# Patient Record
Sex: Male | Born: 1965 | Race: White | Hispanic: No | Marital: Married | Smoking: Never smoker
Health system: Southern US, Community
[De-identification: ages and names within clinical notes are randomized; demographics above are authoritative.]

## PROBLEM LIST (undated history)

## (undated) DIAGNOSIS — M6530 Trigger finger, unspecified finger: Secondary | ICD-10-CM

## (undated) DIAGNOSIS — I10 Essential (primary) hypertension: Secondary | ICD-10-CM

## (undated) DIAGNOSIS — M1990 Unspecified osteoarthritis, unspecified site: Secondary | ICD-10-CM

## (undated) DIAGNOSIS — E785 Hyperlipidemia, unspecified: Secondary | ICD-10-CM

## (undated) DIAGNOSIS — M109 Gout, unspecified: Secondary | ICD-10-CM

## (undated) DIAGNOSIS — F419 Anxiety disorder, unspecified: Secondary | ICD-10-CM

## (undated) DIAGNOSIS — F329 Major depressive disorder, single episode, unspecified: Secondary | ICD-10-CM

## (undated) DIAGNOSIS — F32A Depression, unspecified: Secondary | ICD-10-CM

## (undated) HISTORY — PX: HERNIA REPAIR: SHX51

---
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
## 2008-06-22 HISTORY — PX: UMBILICAL HERNIA REPAIR: SHX196

## 2009-10-06 IMAGING — CR DG ORBITS FOR FOREIGN BODY
2 series · 2 of 2 positions shown · non-contrast
Comparison: None

CLINICAL DATA: Pre MRI orbits.

ORBITS FOR FOREIGN BODY - 2 VIEW

[w waters (1 of 2)]
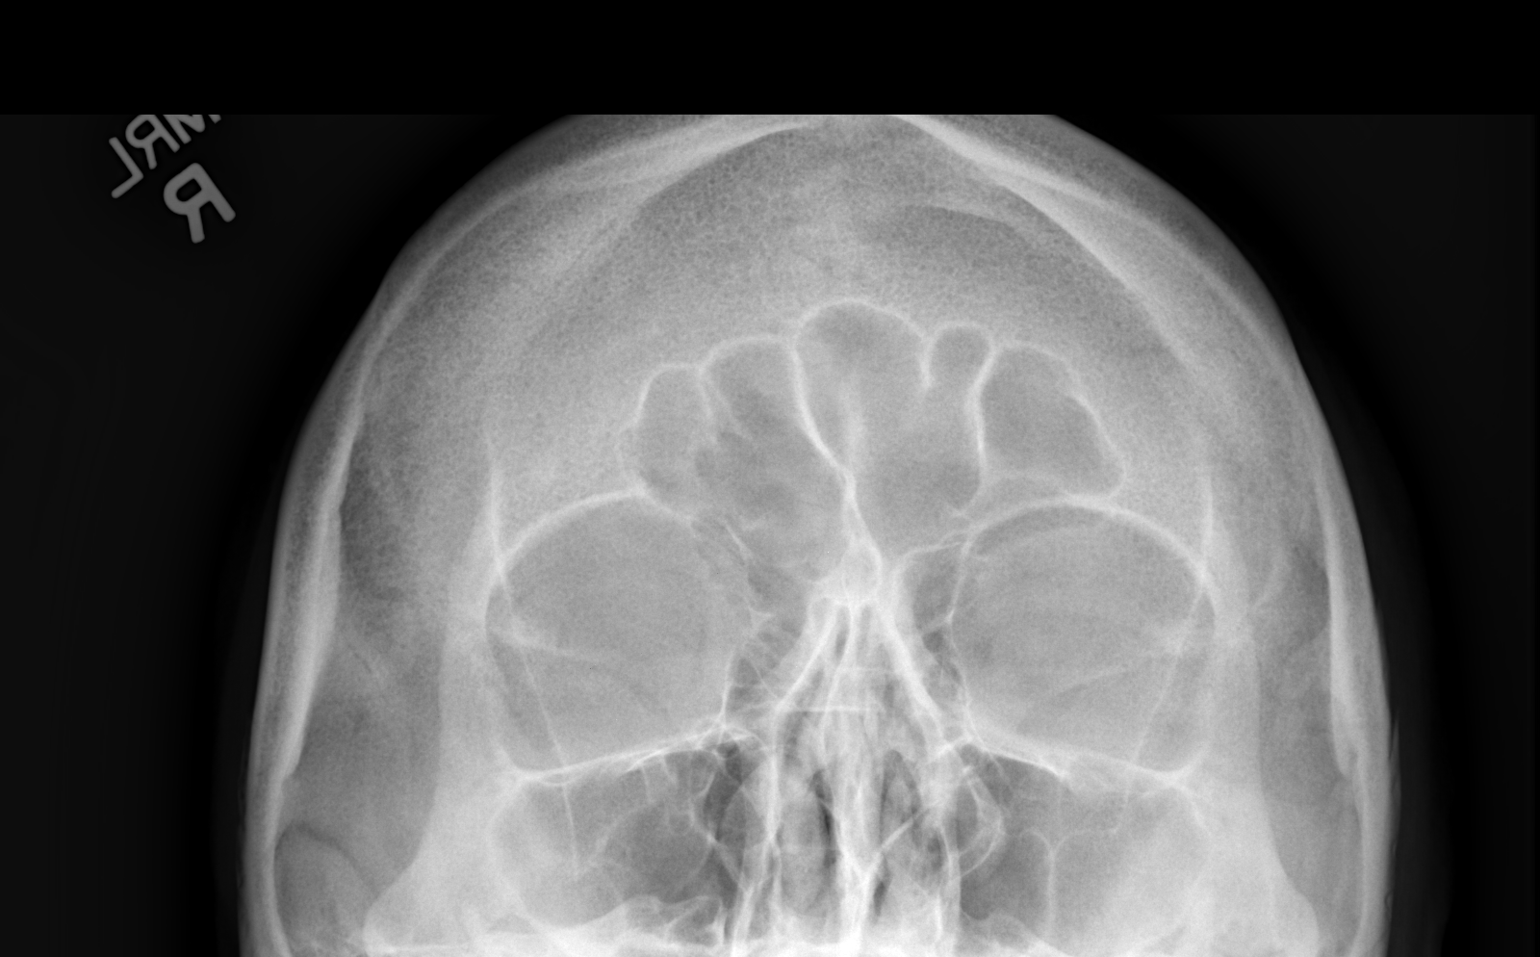

[w waters (2 of 2)]
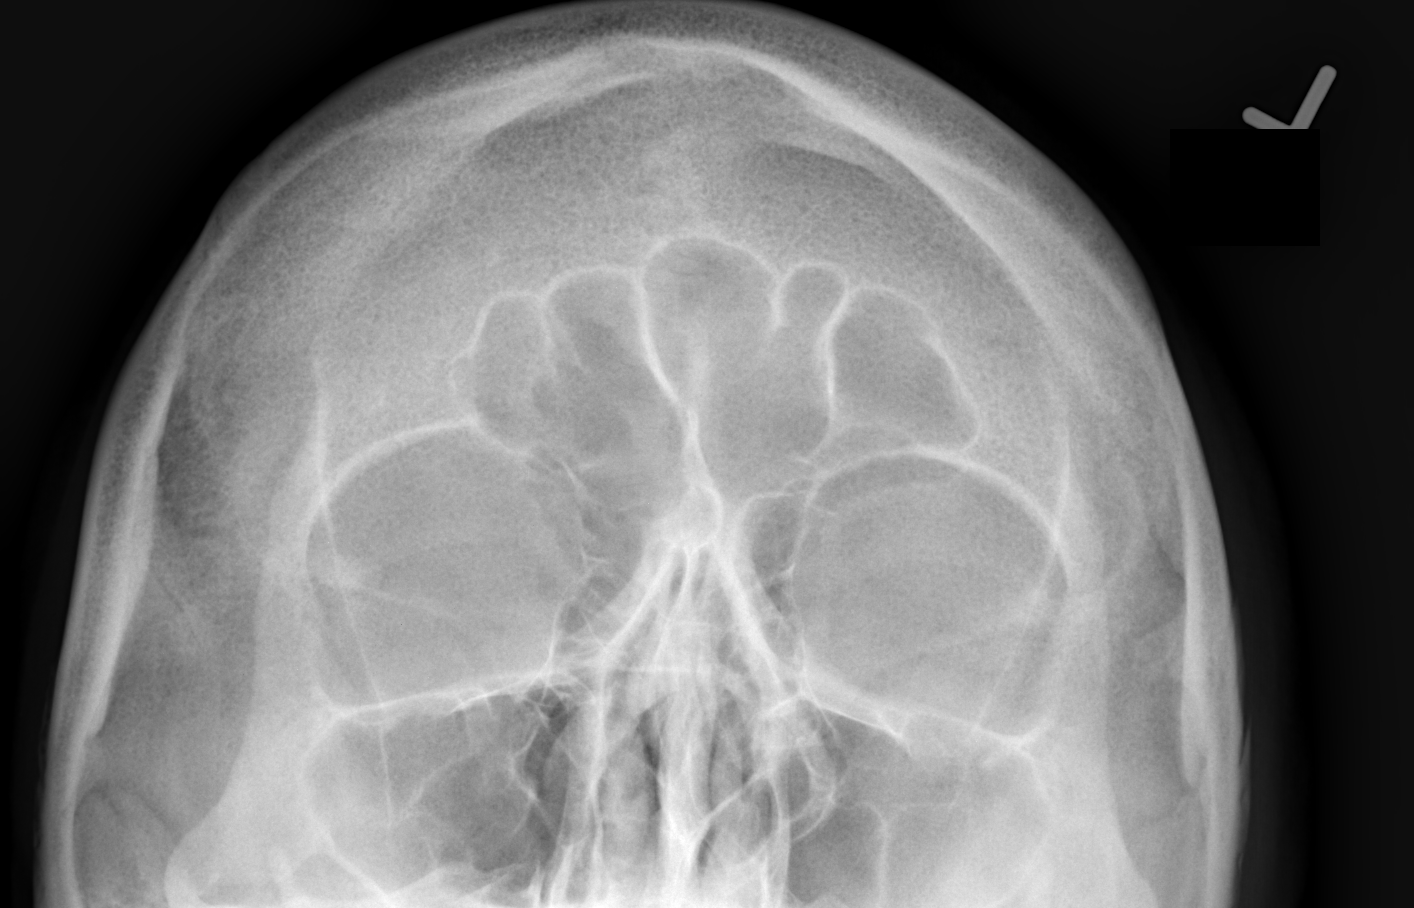

[2 of 2 positions shown; findings below may reference images not displayed]

FINDINGS: No metallic foreign bodies are seen.  The visualized
paranasal sinuses are clear.
IMPRESSION: Negative orbits for metallic foreign body.

## 2010-06-22 HISTORY — PX: SHOULDER ARTHROSCOPY WITH ROTATOR CUFF REPAIR: SHX5685

## 2010-06-26 ENCOUNTER — Ambulatory Visit (HOSPITAL_COMMUNITY)
Admission: RE | Admit: 2010-06-26 | Discharge: 2010-06-26 | Payer: Self-pay | Source: Home / Self Care | Attending: Unknown Physician Specialty | Admitting: Unknown Physician Specialty

## 2013-11-17 ENCOUNTER — Other Ambulatory Visit: Payer: Self-pay | Admitting: Orthopedic Surgery

## 2013-12-07 ENCOUNTER — Encounter (HOSPITAL_BASED_OUTPATIENT_CLINIC_OR_DEPARTMENT_OTHER): Payer: Self-pay | Admitting: *Deleted

## 2013-12-07 NOTE — Progress Notes (Signed)
Will call for recent labs has never had ekg-will need one if anesth wants

## 2013-12-07 NOTE — Progress Notes (Signed)
12/07/13 1308  OBSTRUCTIVE SLEEP APNEA  Have you ever been diagnosed with sleep apnea through a sleep study? No  Do you snore loudly (loud enough to be heard through closed doors)?  0  Do you often feel tired, fatigued, or sleepy during the daytime? 0  Has anyone observed you stop breathing during your sleep? 0  Do you have, or are you being treated for high blood pressure? 1  BMI more than 35 kg/m2? 0  Age over 49 years old? 1  Neck circumference greater than 40 cm/16 inches? 1  Gender: 1  Obstructive Sleep Apnea Score 4  Score 4 or greater  Results sent to PCP

## 2013-12-11 ENCOUNTER — Ambulatory Visit (HOSPITAL_BASED_OUTPATIENT_CLINIC_OR_DEPARTMENT_OTHER): Payer: 59 | Admitting: Anesthesiology

## 2013-12-11 ENCOUNTER — Encounter (HOSPITAL_BASED_OUTPATIENT_CLINIC_OR_DEPARTMENT_OTHER): Payer: 59 | Admitting: Anesthesiology

## 2013-12-11 ENCOUNTER — Ambulatory Visit (HOSPITAL_BASED_OUTPATIENT_CLINIC_OR_DEPARTMENT_OTHER)
Admission: RE | Admit: 2013-12-11 | Discharge: 2013-12-11 | Disposition: A | Discharge status: Home / Self Care | Payer: 59 | Source: Ambulatory Visit | Attending: Orthopedic Surgery | Admitting: Orthopedic Surgery

## 2013-12-11 ENCOUNTER — Encounter (HOSPITAL_BASED_OUTPATIENT_CLINIC_OR_DEPARTMENT_OTHER)
Admission: RE | Disposition: A | Discharge status: Home / Self Care | Payer: Self-pay | Source: Ambulatory Visit | Attending: Orthopedic Surgery

## 2013-12-11 ENCOUNTER — Encounter (HOSPITAL_BASED_OUTPATIENT_CLINIC_OR_DEPARTMENT_OTHER): Payer: Self-pay | Admitting: *Deleted

## 2013-12-11 DIAGNOSIS — M795 Residual foreign body in soft tissue: Secondary | ICD-10-CM | POA: Insufficient documentation

## 2013-12-11 DIAGNOSIS — Z189 Retained foreign body fragments, unspecified material: Secondary | ICD-10-CM | POA: Insufficient documentation

## 2013-12-11 DIAGNOSIS — I10 Essential (primary) hypertension: Secondary | ICD-10-CM | POA: Insufficient documentation

## 2013-12-11 DIAGNOSIS — E785 Hyperlipidemia, unspecified: Secondary | ICD-10-CM | POA: Insufficient documentation

## 2013-12-11 DIAGNOSIS — M109 Gout, unspecified: Secondary | ICD-10-CM | POA: Insufficient documentation

## 2013-12-11 HISTORY — DX: Hyperlipidemia, unspecified: E78.5

## 2013-12-11 HISTORY — DX: Gout, unspecified: M10.9

## 2013-12-11 HISTORY — PX: MASS EXCISION: SHX2000

## 2013-12-11 HISTORY — DX: Essential (primary) hypertension: I10

## 2013-12-11 LAB — POCT HEMOGLOBIN-HEMACUE: Hemoglobin: 15.9 g/dL (ref 13.0–17.0)

## 2013-12-11 SURGERY — EXCISION MASS
Anesthesia: Monitor Anesthesia Care | Site: Finger | Laterality: Left

## 2013-12-11 MED ORDER — LIDOCAINE HCL (PF) 1 % IJ SOLN
INTRAMUSCULAR | Status: AC
  Filled 2013-12-11: qty 5

## 2013-12-11 MED ORDER — FENTANYL CITRATE 0.05 MG/ML IJ SOLN
INTRAMUSCULAR | Status: DC | PRN
  Administered 2013-12-11: 100 ug via INTRAVENOUS

## 2013-12-11 MED ORDER — MIDAZOLAM HCL 2 MG/2ML IJ SOLN
INTRAMUSCULAR | Status: AC
  Filled 2013-12-11: qty 2

## 2013-12-11 MED ORDER — LIDOCAINE HCL (PF) 1 % IJ SOLN
INTRAMUSCULAR | Status: DC | PRN
  Administered 2013-12-11: 1.5 mL

## 2013-12-11 MED ORDER — BUPIVACAINE HCL (PF) 0.25 % IJ SOLN
INTRAMUSCULAR | Status: AC
  Filled 2013-12-11: qty 30

## 2013-12-11 MED ORDER — CEFAZOLIN SODIUM-DEXTROSE 2-3 GM-% IV SOLR
INTRAVENOUS | Status: AC
  Filled 2013-12-11: qty 50

## 2013-12-11 MED ORDER — MIDAZOLAM HCL 2 MG/2ML IJ SOLN: 1.0000 mg | INTRAMUSCULAR | Status: DC | PRN

## 2013-12-11 MED ORDER — HYDROCODONE-ACETAMINOPHEN 5-325 MG PO TABS: 1.0000 | ORAL_TABLET | Freq: Four times a day (QID) | ORAL | Status: DC | PRN

## 2013-12-11 MED ORDER — CHLORHEXIDINE GLUCONATE 4 % EX LIQD: 60.0000 mL | Freq: Once | CUTANEOUS | Status: DC

## 2013-12-11 MED ORDER — LIDOCAINE HCL (CARDIAC) 20 MG/ML IV SOLN
INTRAVENOUS | Status: DC | PRN
  Administered 2013-12-11: 5 mg via INTRAVENOUS

## 2013-12-11 MED ORDER — BUPIVACAINE HCL (PF) 0.25 % IJ SOLN
INTRAMUSCULAR | Status: DC | PRN
  Administered 2013-12-11: 1.5 mL

## 2013-12-11 MED ORDER — FENTANYL CITRATE 0.05 MG/ML IJ SOLN: 50.0000 ug | INTRAMUSCULAR | Status: DC | PRN

## 2013-12-11 MED ORDER — CEFAZOLIN SODIUM-DEXTROSE 2-3 GM-% IV SOLR: 2.0000 g | INTRAVENOUS | Status: DC

## 2013-12-11 MED ORDER — FENTANYL CITRATE 0.05 MG/ML IJ SOLN
INTRAMUSCULAR | Status: AC
  Filled 2013-12-11: qty 4

## 2013-12-11 MED ORDER — MIDAZOLAM HCL 5 MG/5ML IJ SOLN
INTRAMUSCULAR | Status: DC | PRN
  Administered 2013-12-11: 2 mg via INTRAVENOUS

## 2013-12-11 MED ORDER — CEFAZOLIN SODIUM-DEXTROSE 2-3 GM-% IV SOLR
2.0000 g | INTRAVENOUS | Status: AC
Start: 2013-12-11 — End: 2013-12-11
  Administered 2013-12-11: 2 g via INTRAVENOUS

## 2013-12-11 MED ORDER — PROPOFOL 10 MG/ML IV BOLUS
INTRAVENOUS | Status: DC | PRN
  Administered 2013-12-11 (×4): 20 mg via INTRAVENOUS

## 2013-12-11 MED ORDER — LACTATED RINGERS IV SOLN
INTRAVENOUS | Status: DC
  Administered 2013-12-11: 12:00:00 via INTRAVENOUS

## 2013-12-11 SURGICAL SUPPLY — 50 items
BANDAGE COBAN STERILE 2 (GAUZE/BANDAGES/DRESSINGS) IMPLANT
BLADE MINI RND TIP GREEN BEAV (BLADE) IMPLANT
BLADE SURG 15 STRL LF DISP TIS (BLADE) ×1 IMPLANT
BLADE SURG 15 STRL SS (BLADE) ×1
BNDG COHESIVE 1X5 TAN STRL LF (GAUZE/BANDAGES/DRESSINGS) ×2 IMPLANT
BNDG COHESIVE 3X5 TAN STRL LF (GAUZE/BANDAGES/DRESSINGS) IMPLANT
BNDG ESMARK 4X9 LF (GAUZE/BANDAGES/DRESSINGS) ×2 IMPLANT
BNDG GAUZE ELAST 4 BULKY (GAUZE/BANDAGES/DRESSINGS) IMPLANT
CHLORAPREP W/TINT 26ML (MISCELLANEOUS) ×2 IMPLANT
CORDS BIPOLAR (ELECTRODE) ×2 IMPLANT
COVER MAYO STAND STRL (DRAPES) ×2 IMPLANT
COVER TABLE BACK 60X90 (DRAPES) ×2 IMPLANT
CUFF TOURNIQUET SINGLE 18IN (TOURNIQUET CUFF) ×2 IMPLANT
DECANTER SPIKE VIAL GLASS SM (MISCELLANEOUS) IMPLANT
DRAIN PENROSE 1/2X12 LTX STRL (WOUND CARE) IMPLANT
DRAPE EXTREMITY T 121X128X90 (DRAPE) ×2 IMPLANT
DRAPE SURG 17X23 STRL (DRAPES) ×2 IMPLANT
GAUZE SPONGE 4X4 12PLY STRL (GAUZE/BANDAGES/DRESSINGS) ×2 IMPLANT
GAUZE XEROFORM 1X8 LF (GAUZE/BANDAGES/DRESSINGS) ×2 IMPLANT
GLOVE BIO SURGEON STRL SZ 6.5 (GLOVE) ×2 IMPLANT
GLOVE BIO SURGEON STRL SZ7.5 (GLOVE) ×2 IMPLANT
GLOVE BIOGEL PI IND STRL 8 (GLOVE) ×1 IMPLANT
GLOVE BIOGEL PI IND STRL 8.5 (GLOVE) ×1 IMPLANT
GLOVE BIOGEL PI INDICATOR 8 (GLOVE) ×1
GLOVE BIOGEL PI INDICATOR 8.5 (GLOVE) ×1
GLOVE SURG ORTHO 8.0 STRL STRW (GLOVE) ×2 IMPLANT
GOWN STRL REUS W/ TWL LRG LVL3 (GOWN DISPOSABLE) IMPLANT
GOWN STRL REUS W/TWL LRG LVL3 (GOWN DISPOSABLE)
GOWN STRL REUS W/TWL XL LVL3 (GOWN DISPOSABLE) ×2 IMPLANT
NDL SAFETY ECLIPSE 18X1.5 (NEEDLE) IMPLANT
NEEDLE 27GAX1X1/2 (NEEDLE) ×2 IMPLANT
NEEDLE HYPO 18GX1.5 SHARP (NEEDLE)
NS IRRIG 1000ML POUR BTL (IV SOLUTION) ×2 IMPLANT
PACK BASIN DAY SURGERY FS (CUSTOM PROCEDURE TRAY) ×2 IMPLANT
PAD CAST 3X4 CTTN HI CHSV (CAST SUPPLIES) IMPLANT
PADDING CAST ABS 3INX4YD NS (CAST SUPPLIES)
PADDING CAST ABS 4INX4YD NS (CAST SUPPLIES) ×1
PADDING CAST ABS COTTON 3X4 (CAST SUPPLIES) IMPLANT
PADDING CAST ABS COTTON 4X4 ST (CAST SUPPLIES) ×1 IMPLANT
PADDING CAST COTTON 3X4 STRL (CAST SUPPLIES)
SPLINT PLASTER CAST XFAST 3X15 (CAST SUPPLIES) IMPLANT
SPLINT PLASTER XTRA FASTSET 3X (CAST SUPPLIES)
STOCKINETTE 4X48 STRL (DRAPES) ×2 IMPLANT
SUT VIC AB 4-0 P2 18 (SUTURE) IMPLANT
SUT VICRYL RAPID 5 0 P 3 (SUTURE) IMPLANT
SUT VICRYL RAPIDE 4/0 PS 2 (SUTURE) ×2 IMPLANT
SYR BULB 3OZ (MISCELLANEOUS) ×2 IMPLANT
SYR CONTROL 10ML LL (SYRINGE) ×2 IMPLANT
TOWEL OR 17X24 6PK STRL BLUE (TOWEL DISPOSABLE) ×2 IMPLANT
UNDERPAD 30X30 INCONTINENT (UNDERPADS AND DIAPERS) ×2 IMPLANT

## 2013-12-11 NOTE — H&P (Signed)
Benjamin Ray is a 48 year-old right-hand dominant male with occasional numbness of his index finger, left hand and a questionable foreign body.  He states that this occurred at the metacarpophalangeal joint over Christmas when he got a thorn from his Christmas tree in an area where he had a prior injury as a child at the age of 48 he was stuck with a thorn.  He complains of a mass which moves with his extensor tendon.  He has no history of injury.  He has no history of injury to the hand or neck other than the Christmas tree and the old laceration.  He has no history of diabetes or thyroid problems.  He does have history of gout.  He complaints of intermittent, moderate burning sharp pain, occasional numbness and tingling to the finger. Activity and work make this worse.  Rest makes it better.  I have taken care of his wife for a Dupuytren's contracture.  ALLERGIES:     None.  MEDICATIONS:     Diovan, amlodipine, pravastatin, allopurinol.    SURGICAL HISTORY:     Right shoulder surgery and retinal surgery.  FAMILY MEDICAL HISTORY:     Positive for high blood pressure.    SOCIAL HISTORY:     He does not smoke, drinks socially.  He is married.  He works at [employer].  REVIEW OF SYSTEMS:    Positive for glasses, high blood pressure, otherwise negative 14 points.  Benjamin DresserDavid Ray is an 48 y.o. male.   Chief Complaint: mass left index finger HPI: see above  Past Medical History  Diagnosis Date  . Hypertension   . Gout   . Hyperlipemia     Past Surgical History  Procedure Laterality Date  . Umbilical hernia repair  2010  . Shoulder arthroscopy with rotator cuff repair  2012    right    History reviewed. No pertinent family history. Social History:  reports that he has never smoked. He does not have any smokeless tobacco history on file. He reports that he drinks alcohol. He reports that he does not use illicit drugs.  Allergies: No Known Allergies  Medications Prior to Admission   Medication Sig Dispense Refill  . allopurinol (ZYLOPRIM) 100 MG tablet Take 100 mg by mouth daily.      Marland Kitchen. amLODipine (NORVASC) 5 MG tablet Take 5 mg by mouth daily.      . pravastatin (PRAVACHOL) 20 MG tablet Take 20 mg by mouth daily.      . valsartan (DIOVAN) 160 MG tablet Take 160 mg by mouth daily.        Results for orders placed during the hospital encounter of 12/11/13 (from the past 48 hour(s))  POCT HEMOGLOBIN-HEMACUE     Status: None   Collection Time    12/11/13 12:29 PM      Result Value Ref Range   Hemoglobin 15.9  13.0 - 17.0 g/dL    No results found.   A comprehensive review of systems was negative.  Blood pressure 153/92, pulse 84, temperature 98.6 F (37 C), temperature source Oral, resp. rate 20, height 6' (1.829 m), weight 91.173 kg (201 lb), SpO2 100.00%.  General appearance: alert, cooperative and appears stated age Head: Normocephalic, without obvious abnormality Neck: no JVD Resp: clear to auscultation bilaterally Cardio: regular rate and rhythm, S1, S2 normal, no murmur, click, rub or gallop GI: soft, non-tender; bowel sounds normal; no masses,  no organomegaly Extremities: extremities normal, atraumatic, no cyanosis or edema Pulses: 2+  and symmetric Skin: Skin color, texture, turgor normal. No rashes or lesions Neurologic: Grossly normal Incision/Wound: Healed laceration index   Assessment/Plan RADIOGRAPHS:    X-rays are negative with no discrete foreign material visible, it is palpable.   RECOMMENDATIONS/PLAN:    He would like to have this removed.  He is scheduled for excision mass foreign body left index finger as an outpatient under regional anesthesia.  Karter Haire R 12/11/2013, 2:04 PM

## 2013-12-11 NOTE — Anesthesia Preprocedure Evaluation (Signed)
Anesthesia Evaluation  Patient identified by MRN, date of birth, ID band Patient awake    Reviewed: Allergy & Precautions, H&P , NPO status , Patient's Chart, lab work & pertinent test results  Airway Mallampati: I TM Distance: >3 FB Neck ROM: Full    Dental   Pulmonary          Cardiovascular hypertension, Pt. on medications     Neuro/Psych    GI/Hepatic   Endo/Other    Renal/GU      Musculoskeletal   Abdominal   Peds  Hematology   Anesthesia Other Findings   Reproductive/Obstetrics                           Anesthesia Physical Anesthesia Plan  ASA: II  Anesthesia Plan: MAC   Post-op Pain Management:    Induction: Intravenous  Airway Management Planned: Simple Face Mask  Additional Equipment:   Intra-op Plan:   Post-operative Plan:   Informed Consent: I have reviewed the patients History and Physical, chart, labs and discussed the procedure including the risks, benefits and alternatives for the proposed anesthesia with the patient or authorized representative who has indicated his/her understanding and acceptance.     Plan Discussed with: CRNA and Surgeon  Anesthesia Plan Comments:         Anesthesia Quick Evaluation  

## 2013-12-11 NOTE — Brief Op Note (Signed)
12/11/2013  2:45 PM  PATIENT:  Larey Dresseravid Harm  48 y.o. male  PRE-OPERATIVE DIAGNOSIS:  FOREIGN BODY MASS LEFT INDEX METACARPAL PHALANGEAL  POST-OPERATIVE DIAGNOSIS:  FOREIGN BODY MASS LEFT INDEX METACARPAL   PROCEDURE:  Procedure(s): EXCISION MASS LEFT INDEX FINGER (Left)  SURGEON:  Surgeon(s) and Role:    * Nicki ReaperGary R Trudee Chirino, MD - Primary  PHYSICIAN ASSISTANT:   ASSISTANTS: none   ANESTHESIA:   local and IV sedation  EBL:  Total I/O In: 900 [I.V.:900] Out: -   BLOOD ADMINISTERED:none  DRAINS: none   LOCAL MEDICATIONS USED:  BUPIVICAINE  and XYLOCAINE   SPECIMEN:  Excision  DISPOSITION OF SPECIMEN:  PATHOLOGY  COUNTS:  YES  TOURNIQUET:   Total Tourniquet Time Documented: Upper Arm (Left) - 10 minutes Total: Upper Arm (Left) - 10 minutes   DICTATION: .Other Dictation: Dictation Number [id number]  PLAN OF CARE: Discharge to home after PACU  PATIENT DISPOSITION:  PACU - hemodynamically stable.

## 2013-12-11 NOTE — Anesthesia Procedure Notes (Signed)
Procedure Name: MAC Date/Time: 12/11/2013 2:14 PM Performed by: Zenia ResidesPAYNE, LINDA D Pre-anesthesia Checklist: Patient identified, Emergency Drugs available, Suction available, Patient being monitored and Timeout performed Patient Re-evaluated:Patient Re-evaluated prior to inductionOxygen Delivery Method: Simple face mask

## 2013-12-11 NOTE — Transfer of Care (Signed)
Immediate Anesthesia Transfer of Care Note  Patient: Benjamin Ray  Procedure(s) Performed: Procedure(s): EXCISION MASS LEFT INDEX FINGER (Left)  Patient Location: PACU  Anesthesia Type:MAC  Level of Consciousness: awake, alert  and oriented  Airway & Oxygen Therapy: Patient Spontanous Breathing  Post-op Assessment: Report given to PACU RN and Post -op Vital signs reviewed and stable  Post vital signs: Reviewed and stable  Complications: No apparent anesthesia complications

## 2013-12-11 NOTE — Anesthesia Postprocedure Evaluation (Signed)
Anesthesia Post Note  Patient: Benjamin DresserDavid Ray  Procedure(s) Performed: Procedure(s) (LRB): EXCISION MASS LEFT INDEX FINGER (Left)  Anesthesia type: general  Patient location: PACU  Post pain: Pain level controlled  Post assessment: Patient's Cardiovascular Status Stable  Last Vitals:  Filed Vitals:   12/11/13 1445  BP: 119/73  Pulse: 83  Temp: 36.5 C  Resp: 16    Post vital signs: Reviewed and stable  Level of consciousness: sedated  Complications: No apparent anesthesia complications

## 2013-12-11 NOTE — Discharge Instructions (Addendum)

## 2013-12-11 NOTE — Op Note (Signed)
Dictated number:[id number]

## 2013-12-12 ENCOUNTER — Encounter (HOSPITAL_BASED_OUTPATIENT_CLINIC_OR_DEPARTMENT_OTHER): Payer: Self-pay | Admitting: Orthopedic Surgery

## 2013-12-12 NOTE — Op Note (Signed)
NAME:  Benjamin Ray, Keshun                ACCOUNT NO.:  [id number]  MEDICAL RECORD NO.:  [id number]  LOCATION:                                 FACILITY:  PHYSICIAN:  Cindee SaltGary Kuzma, M.D.            DATE OF BIRTH:  DATE OF PROCEDURE:  12/11/2013 DATE OF DISCHARGE:                              OPERATIVE REPORT   PREOPERATIVE DIAGNOSIS:  Mass, left index finger.  POSTOPERATIVE DIAGNOSIS:  Mass, left index finger.  OPERATION:  Excisional biopsy of mass, left index finger.  SURGEON:  Cindee SaltGary Kuzma, M.D.  ANESTHESIA:  Local with sedation using Xylocaine and bupivacaine 1% and 0.25% plain.  ANESTHESIOLOGIST:  Kaylyn LayerKevin D. Michelle Piperssey, M.D.  HISTORY:  The patient is a 48 year old male with an old laceration over the metacarpophalangeal joint of his left index finger.  He has developed a mass and desirous of having this excised and that is causing him discomfort.  X-rays are negative.  Pre, peri, and postoperative course have been discussed along with risks and complications.  He is aware that there was no guarantee with surgery; possibility of infection; recurrence of injury to arteries, nerves, tendons; incomplete relief of symptoms and dystrophy.  In the preoperative area, the patient was seen, the extremity marked by both patient and surgeon, and antibiotic given.  PROCEDURE IN DETAIL:  The patient was brought to the operating room where a local anesthetic under IV sedation was produced after prep using ChloraPrep.  Time-out taken.  The limb was exsanguinated with an Esmarch bandage.  Tourniquet was inflated to 250 mmHg.  A local injection of 0.25% Marcaine, 1% Xylocaine without epinephrine was given, approximately 5 mL was used.  A curvilinear incision was made directly over the mass, carried down through the subcutaneous tissue.  The extensor tendon was identified, a cyst was present.  This was excised and sent to Pathology.  The wound was copiously irrigated with saline. No further lesions were  noted.  The wound was again irrigated and the skin closed with interrupted 4-0 Vicryl Rapide sutures.  Sterile compressive dressing was applied.  On deflation of the tourniquet, all fingers were immediately pinked.  He was taken to the recovery room for observation in satisfactory condition.  He will be discharged to home [facility] [place] [place].         ______________________________ Cindee SaltGary Kuzma, M.D.    GK/MEDQ  D:  12/11/2013  T:  12/12/2013  Job:  [id number]

## 2017-11-22 DIAGNOSIS — M6530 Trigger finger, unspecified finger: Secondary | ICD-10-CM | POA: Insufficient documentation

## 2017-11-22 DIAGNOSIS — R52 Pain, unspecified: Secondary | ICD-10-CM | POA: Insufficient documentation

## 2018-09-23 DIAGNOSIS — M25531 Pain in right wrist: Secondary | ICD-10-CM | POA: Insufficient documentation

## 2018-10-27 ENCOUNTER — Other Ambulatory Visit: Payer: Self-pay | Admitting: Orthopedic Surgery

## 2018-10-28 ENCOUNTER — Other Ambulatory Visit: Payer: Self-pay | Admitting: Orthopedic Surgery

## 2018-11-07 ENCOUNTER — Encounter (HOSPITAL_BASED_OUTPATIENT_CLINIC_OR_DEPARTMENT_OTHER): Payer: Self-pay | Admitting: *Deleted

## 2018-11-07 ENCOUNTER — Other Ambulatory Visit: Payer: Self-pay

## 2018-11-08 ENCOUNTER — Other Ambulatory Visit: Payer: Self-pay | Admitting: Orthopedic Surgery

## 2018-11-08 DIAGNOSIS — S63501A Unspecified sprain of right wrist, initial encounter: Secondary | ICD-10-CM

## 2018-11-11 ENCOUNTER — Other Ambulatory Visit: Payer: Self-pay

## 2018-11-11 ENCOUNTER — Other Ambulatory Visit (HOSPITAL_COMMUNITY)
Admission: RE | Admit: 2018-11-11 | Discharge: 2018-11-11 | Disposition: A | Discharge status: Home / Self Care | Payer: 59 | Source: Ambulatory Visit | Attending: Orthopedic Surgery | Admitting: Orthopedic Surgery

## 2018-11-11 ENCOUNTER — Encounter (HOSPITAL_BASED_OUTPATIENT_CLINIC_OR_DEPARTMENT_OTHER)
Admission: RE | Admit: 2018-11-11 | Discharge: 2018-11-11 | Disposition: A | Discharge status: Home / Self Care | Payer: 59 | Source: Ambulatory Visit | Attending: Orthopedic Surgery | Admitting: Orthopedic Surgery

## 2018-11-11 DIAGNOSIS — R9431 Abnormal electrocardiogram [ECG] [EKG]: Secondary | ICD-10-CM | POA: Insufficient documentation

## 2018-11-11 DIAGNOSIS — Z01818 Encounter for other preprocedural examination: Secondary | ICD-10-CM | POA: Insufficient documentation

## 2018-11-11 DIAGNOSIS — Z1159 Encounter for screening for other viral diseases: Secondary | ICD-10-CM | POA: Insufficient documentation

## 2018-11-11 NOTE — Progress Notes (Signed)
Pt arrived for EKG. Ensure pre-surgery drink given to pt with written instruction to finish drinking by 0530 on DOS. Pt verbalized understanding.

## 2018-11-11 NOTE — Progress Notes (Signed)
EKG reviewed by Dr. Singer, will proceed with surgery as scheduled.  

## 2018-11-12 LAB — NOVEL CORONAVIRUS, NAA (HOSP ORDER, SEND-OUT TO REF LAB; TAT 18-24 HRS): SARS-CoV-2, NAA: NOT DETECTED

## 2018-11-15 ENCOUNTER — Ambulatory Visit (HOSPITAL_BASED_OUTPATIENT_CLINIC_OR_DEPARTMENT_OTHER)
Admission: RE | Admit: 2018-11-15 | Discharge: 2018-11-15 | Disposition: A | Discharge status: Home / Self Care | Payer: 59 | Attending: Orthopedic Surgery | Admitting: Orthopedic Surgery

## 2018-11-15 ENCOUNTER — Ambulatory Visit (HOSPITAL_BASED_OUTPATIENT_CLINIC_OR_DEPARTMENT_OTHER): Payer: 59 | Admitting: Anesthesiology

## 2018-11-15 ENCOUNTER — Encounter (HOSPITAL_BASED_OUTPATIENT_CLINIC_OR_DEPARTMENT_OTHER)
Admission: RE | Disposition: A | Discharge status: Home / Self Care | Payer: Self-pay | Source: Home / Self Care | Attending: Orthopedic Surgery

## 2018-11-15 ENCOUNTER — Encounter (HOSPITAL_BASED_OUTPATIENT_CLINIC_OR_DEPARTMENT_OTHER): Payer: Self-pay | Admitting: Anesthesiology

## 2018-11-15 DIAGNOSIS — E785 Hyperlipidemia, unspecified: Secondary | ICD-10-CM | POA: Diagnosis not present

## 2018-11-15 DIAGNOSIS — M25531 Pain in right wrist: Secondary | ICD-10-CM | POA: Diagnosis not present

## 2018-11-15 DIAGNOSIS — Z79899 Other long term (current) drug therapy: Secondary | ICD-10-CM | POA: Insufficient documentation

## 2018-11-15 DIAGNOSIS — F419 Anxiety disorder, unspecified: Secondary | ICD-10-CM | POA: Insufficient documentation

## 2018-11-15 DIAGNOSIS — M65332 Trigger finger, left middle finger: Secondary | ICD-10-CM | POA: Insufficient documentation

## 2018-11-15 DIAGNOSIS — M109 Gout, unspecified: Secondary | ICD-10-CM | POA: Diagnosis not present

## 2018-11-15 DIAGNOSIS — F329 Major depressive disorder, single episode, unspecified: Secondary | ICD-10-CM | POA: Diagnosis not present

## 2018-11-15 DIAGNOSIS — I10 Essential (primary) hypertension: Secondary | ICD-10-CM | POA: Insufficient documentation

## 2018-11-15 DIAGNOSIS — M65842 Other synovitis and tenosynovitis, left hand: Secondary | ICD-10-CM | POA: Insufficient documentation

## 2018-11-15 HISTORY — PX: TRIGGER FINGER RELEASE: SHX641

## 2018-11-15 HISTORY — DX: Depression, unspecified: F32.A

## 2018-11-15 HISTORY — DX: Trigger finger, unspecified finger: M65.30

## 2018-11-15 HISTORY — DX: Anxiety disorder, unspecified: F41.9

## 2018-11-15 SURGERY — RELEASE, A1 PULLEY, FOR TRIGGER FINGER
Anesthesia: Regional | Site: Hand | Laterality: Left

## 2018-11-15 MED ORDER — FENTANYL CITRATE (PF) 100 MCG/2ML IJ SOLN
50.0000 ug | INTRAMUSCULAR | Status: DC | PRN
  Administered 2018-11-15: 09:00:00 100 ug via INTRAVENOUS

## 2018-11-15 MED ORDER — SCOPOLAMINE 1 MG/3DAYS TD PT72: 1.0000 | MEDICATED_PATCH | Freq: Once | TRANSDERMAL | Status: DC | PRN

## 2018-11-15 MED ORDER — TRAMADOL HCL 50 MG PO TABS: 50.0000 mg | ORAL_TABLET | Freq: Four times a day (QID) | ORAL | 0 refills | Status: DC | PRN

## 2018-11-15 MED ORDER — PROPOFOL 10 MG/ML IV BOLUS
INTRAVENOUS | Status: DC | PRN
  Administered 2018-11-15: 40 mg via INTRAVENOUS

## 2018-11-15 MED ORDER — MIDAZOLAM HCL 2 MG/2ML IJ SOLN
1.0000 mg | INTRAMUSCULAR | Status: DC | PRN
  Administered 2018-11-15: 2 mg via INTRAVENOUS

## 2018-11-15 MED ORDER — MIDAZOLAM HCL 2 MG/2ML IJ SOLN
INTRAMUSCULAR | Status: AC
  Filled 2018-11-15: qty 2

## 2018-11-15 MED ORDER — ONDANSETRON HCL 4 MG/2ML IJ SOLN
INTRAMUSCULAR | Status: DC | PRN
  Administered 2018-11-15: 4 mg via INTRAVENOUS

## 2018-11-15 MED ORDER — ONDANSETRON HCL 4 MG/2ML IJ SOLN
INTRAMUSCULAR | Status: AC
  Filled 2018-11-15: qty 2

## 2018-11-15 MED ORDER — MEPERIDINE HCL 25 MG/ML IJ SOLN: 6.2500 mg | INTRAMUSCULAR | Status: DC | PRN

## 2018-11-15 MED ORDER — LACTATED RINGERS IV SOLN
INTRAVENOUS | Status: DC
  Administered 2018-11-15: 08:00:00 via INTRAVENOUS

## 2018-11-15 MED ORDER — LIDOCAINE HCL (PF) 0.5 % IJ SOLN
INTRAMUSCULAR | Status: DC | PRN
  Administered 2018-11-15: 25 mL via INTRAVENOUS

## 2018-11-15 MED ORDER — FENTANYL CITRATE (PF) 100 MCG/2ML IJ SOLN: 25.0000 ug | INTRAMUSCULAR | Status: DC | PRN

## 2018-11-15 MED ORDER — BUPIVACAINE HCL (PF) 0.25 % IJ SOLN
INTRAMUSCULAR | Status: AC
  Filled 2018-11-15: qty 30

## 2018-11-15 MED ORDER — METOCLOPRAMIDE HCL 5 MG/ML IJ SOLN: 10.0000 mg | Freq: Once | INTRAMUSCULAR | Status: DC | PRN

## 2018-11-15 MED ORDER — CEFAZOLIN SODIUM-DEXTROSE 2-4 GM/100ML-% IV SOLN
2.0000 g | INTRAVENOUS | Status: AC
  Administered 2018-11-15: 2 g via INTRAVENOUS

## 2018-11-15 MED ORDER — FENTANYL CITRATE (PF) 100 MCG/2ML IJ SOLN
INTRAMUSCULAR | Status: AC
  Filled 2018-11-15: qty 2

## 2018-11-15 MED ORDER — CHLORHEXIDINE GLUCONATE 4 % EX LIQD: 60.0000 mL | Freq: Once | CUTANEOUS | Status: DC

## 2018-11-15 MED ORDER — CEFAZOLIN SODIUM-DEXTROSE 2-4 GM/100ML-% IV SOLN
INTRAVENOUS | Status: AC
  Filled 2018-11-15: qty 100

## 2018-11-15 MED ORDER — PROPOFOL 500 MG/50ML IV EMUL
INTRAVENOUS | Status: DC | PRN
  Administered 2018-11-15: 75 ug/kg/min via INTRAVENOUS

## 2018-11-15 MED ORDER — PROPOFOL 500 MG/50ML IV EMUL
INTRAVENOUS | Status: AC
  Filled 2018-11-15: qty 50

## 2018-11-15 MED ORDER — BUPIVACAINE HCL (PF) 0.25 % IJ SOLN
INTRAMUSCULAR | Status: DC | PRN
  Administered 2018-11-15: 6 mL

## 2018-11-15 MED ORDER — LACTATED RINGERS IV SOLN: INTRAVENOUS | Status: DC

## 2018-11-15 SURGICAL SUPPLY — 34 items
BLADE SURG 15 STRL LF DISP TIS (BLADE) ×1 IMPLANT
BLADE SURG 15 STRL SS (BLADE) ×2
BNDG COHESIVE 2X5 TAN STRL LF (GAUZE/BANDAGES/DRESSINGS) ×3 IMPLANT
BNDG ESMARK 4X9 LF (GAUZE/BANDAGES/DRESSINGS) IMPLANT
CHLORAPREP W/TINT 26 (MISCELLANEOUS) ×3 IMPLANT
CORD BIPOLAR FORCEPS 12FT (ELECTRODE) IMPLANT
COVER BACK TABLE REUSABLE LG (DRAPES) ×3 IMPLANT
COVER MAYO STAND REUSABLE (DRAPES) ×3 IMPLANT
COVER WAND RF STERILE (DRAPES) IMPLANT
CUFF TOURN SGL QUICK 18X4 (TOURNIQUET CUFF) IMPLANT
DECANTER SPIKE VIAL GLASS SM (MISCELLANEOUS) IMPLANT
DRAPE EXTREMITY T 121X128X90 (DISPOSABLE) ×3 IMPLANT
DRAPE SURG 17X23 STRL (DRAPES) ×3 IMPLANT
GAUZE SPONGE 4X4 12PLY STRL (GAUZE/BANDAGES/DRESSINGS) ×3 IMPLANT
GAUZE XEROFORM 1X8 LF (GAUZE/BANDAGES/DRESSINGS) ×3 IMPLANT
GLOVE BIOGEL PI IND STRL 7.0 (GLOVE) IMPLANT
GLOVE BIOGEL PI IND STRL 8.5 (GLOVE) ×1 IMPLANT
GLOVE BIOGEL PI INDICATOR 7.0 (GLOVE) ×2
GLOVE BIOGEL PI INDICATOR 8.5 (GLOVE) ×2
GLOVE SURG ORTHO 8.0 STRL STRW (GLOVE) ×3 IMPLANT
GLOVE SURG SS PI 6.5 STRL IVOR (GLOVE) ×2 IMPLANT
GOWN STRL REUS W/ TWL LRG LVL3 (GOWN DISPOSABLE) ×1 IMPLANT
GOWN STRL REUS W/TWL LRG LVL3 (GOWN DISPOSABLE) ×2
GOWN STRL REUS W/TWL XL LVL3 (GOWN DISPOSABLE) ×3 IMPLANT
NDL PRECISIONGLIDE 27X1.5 (NEEDLE) ×1 IMPLANT
NEEDLE PRECISIONGLIDE 27X1.5 (NEEDLE) ×3 IMPLANT
NS IRRIG 1000ML POUR BTL (IV SOLUTION) ×3 IMPLANT
PACK BASIN DAY SURGERY FS (CUSTOM PROCEDURE TRAY) ×3 IMPLANT
STOCKINETTE 4X48 STRL (DRAPES) ×3 IMPLANT
SUT ETHILON 4 0 PS 2 18 (SUTURE) ×3 IMPLANT
SYR BULB 3OZ (MISCELLANEOUS) ×3 IMPLANT
SYR CONTROL 10ML LL (SYRINGE) ×3 IMPLANT
TOWEL GREEN STERILE FF (TOWEL DISPOSABLE) ×6 IMPLANT
UNDERPAD 30X30 (UNDERPADS AND DIAPERS) ×3 IMPLANT

## 2018-11-15 NOTE — Anesthesia Preprocedure Evaluation (Signed)
Anesthesia Evaluation  Patient identified by MRN, date of birth, ID band Patient awake    Reviewed: Allergy & Precautions, NPO status , Patient's Chart, lab work & pertinent test results  Airway Mallampati: II  TM Distance: >3 FB Neck ROM: Full    Dental no notable dental hx.    Pulmonary neg pulmonary ROS,    Pulmonary exam normal breath sounds clear to auscultation       Cardiovascular hypertension, Pt. on medications and Pt. on home beta blockers Normal cardiovascular exam Rhythm:Regular Rate:Normal     Neuro/Psych negative neurological ROS  negative psych ROS   GI/Hepatic negative GI ROS, Neg liver ROS,   Endo/Other  negative endocrine ROS  Renal/GU negative Renal ROS  negative genitourinary   Musculoskeletal negative musculoskeletal ROS (+)   Abdominal   Peds negative pediatric ROS (+)  Hematology negative hematology ROS (+)   Anesthesia Other Findings   Reproductive/Obstetrics negative OB ROS                             Anesthesia Physical Anesthesia Plan  ASA: II  Anesthesia Plan: Bier Block and Bier Block-LIDOCAINE ONLY   Post-op Pain Management:    Induction: Intravenous  PONV Risk Score and Plan: 1 and Treatment may vary due to age or medical condition  Airway Management Planned: Simple Face Mask and Nasal Cannula  Additional Equipment:   Intra-op Plan:   Post-operative Plan:   Informed Consent: I have reviewed the patients History and Physical, chart, labs and discussed the procedure including the risks, benefits and alternatives for the proposed anesthesia with the patient or authorized representative who has indicated his/her understanding and acceptance.     Dental advisory given  Plan Discussed with: CRNA  Anesthesia Plan Comments:         Anesthesia Quick Evaluation

## 2018-11-15 NOTE — Anesthesia Procedure Notes (Signed)
Anesthesia Regional Block: Bier block (IV Regional)   Pre-Anesthetic Checklist: ,, timeout performed, Correct Patient, Correct Site, Correct Laterality, Correct Procedure,, site marked, surgical consent,, at surgeon's request  Laterality: Left     Needles:  Injection technique: Single-shot  Needle Type: Other      Needle Gauge: 22     Additional Needles:   Procedures:,,,,, intact distal pulses, Esmarch exsanguination, single tourniquet utilized,  Narrative:  Start time: 11/15/2018 8:45 AM End time: 11/15/2018 8:46 AM  Performed by: Personally

## 2018-11-15 NOTE — Transfer of Care (Signed)
Immediate Anesthesia Transfer of Care Note  Patient: Benjamin Ray  Procedure(s) Performed: RELEASE LEFT MIDDLE TRIGGER FINGER/A-1 PULLEY (Left Hand)  Patient Location: PACU  Anesthesia Type:MAC  Level of Consciousness: awake, alert  and oriented  Airway & Oxygen Therapy: Patient Spontanous Breathing and Patient connected to nasal cannula oxygen  Post-op Assessment: Report given to RN and Post -op Vital signs reviewed and stable  Post vital signs: Reviewed and stable  Last Vitals:  Vitals Value Taken Time  BP 109/82 11/15/2018  9:13 AM  Temp    Pulse 76 11/15/2018  9:14 AM  Resp 17 11/15/2018  9:14 AM  SpO2 97 % 11/15/2018  9:14 AM  Vitals shown include unvalidated device data.  Last Pain:  Vitals:   11/15/18 0752  TempSrc: Oral  PainSc: 0-No pain         Complications: No apparent anesthesia complications

## 2018-11-15 NOTE — Op Note (Signed)
NAME: Benjamin Ray MEDICAL RECORD NO: [id number] DATE OF BIRTH: December 14, 1965 FACILITY: [facility] LOCATION: [facility] PHYSICIAN: Nicki Reaper, MD   OPERATIVE REPORT   DATE OF PROCEDURE: 11/15/18    PREOPERATIVE DIAGNOSIS:   Stenosing tenosynovitis left middle finger   POSTOPERATIVE DIAGNOSIS:   Same   PROCEDURE:   Resease A1 pulley left middle finger   SURGEON: Cindee Salt, M.D.   ASSISTANT: none   ANESTHESIA:  Bier block with sedation and Local   INTRAVENOUS FLUIDS:  Per anesthesia flow sheet.   ESTIMATED BLOOD LOSS:  Minimal.   COMPLICATIONS:  None.   SPECIMENS:  none   TOURNIQUET TIME:    Total Tourniquet Time Documented: Forearm (Left) - 16 minutes Total: Forearm (Left) - 16 minutes    DISPOSITION:  Stable to PACU.   INDICATIONS: Patient is a 53 year old male with a history of triggering of his left middle finger nonresponsive to conservative treatment including multiple injections.  He is elected to undergo surgical release of the A1 pulley.  Pre-peri-and postoperative course been discussed along with risks and complications.  He is aware there is no guarantee to the surgery the possibility of infection recurrence injury to arteries nerves tendons complete relief symptoms and dystrophy.  In preoperative area the patient is seen extremity marked by both patient and surgeon antibiotic given  OPERATIVE COURSE: Patient is brought to the operating room where form based IV regional anesthetic was carried out without difficulty under the direction of the anesthesia department.  He was prepped and draped in supine position with the left arm free.  A three-minute dry time was allowed timeout taken confirming patient procedure.  DuraPrep was used for the prep.  After adequate anesthesia was afforded oblique incision was made over the A1 pulley of the left middle finger carried down through subcutaneous tissue.  Retractors were placed retracting the radial digital  neurovascular bundles.  The A1 pulley was identified released on its radial aspect a small incision made centrally and A2.  Tenosynovial tissue proximally was partially resected and the 2 tendons separated breaking any adhesions between the 2 tendons.  Finger was placed through full range of motion no further triggering was noted.  The wound was copious irrigated with saline.  Skin was closed interrupted 4-0 nylon sutures.  Local infiltration quarter percent bupivacaine without epinephrine was good approximately 6 cc was used.  Sterile compressive dressing with the fingers 3 was applied.  Deflation of the tourniquet all fingers immediately pink.  He was taken to the recovery room for observation in satisfactory condition.  He will be discharged home to return [facility] in 1 week Tylenol ibuprofen for pain with Ultram as a backup.  Cindee Salt, MD Electronically signed, 11/15/18

## 2018-11-15 NOTE — Discharge Instructions (Addendum)

## 2018-11-15 NOTE — Brief Op Note (Signed)
11/15/2018  9:11 AM  PATIENT:  Benjamin Ray  53 y.o. male  PRE-OPERATIVE DIAGNOSIS:  STENOSING TENOSYNOVITIS OF LEFT MIDDLE FINGER  POST-OPERATIVE DIAGNOSIS:  STENOSING TENOSYNOVITIS OF LEFT MIDDLE FINGER  PROCEDURE:  Procedure(s): RELEASE LEFT MIDDLE TRIGGER FINGER/A-1 PULLEY (Left)  SURGEON:  Surgeon(s) and Role:    Cindee Salt, MD - Primary  PHYSICIAN ASSISTANT:   ASSISTANTS: none   ANESTHESIA:   local, regional and IV sedation  EBL: 1ml BLOOD ADMINISTERED:none  DRAINS: none   LOCAL MEDICATIONS USED:  BUPIVICAINE   SPECIMEN:  No Specimen  DISPOSITION OF SPECIMEN:  N/A  COUNTS:  YES  TOURNIQUET:  * Missing tourniquet times found for documented tourniquets in log: [id number] *  DICTATION: .Dragon Dictation  PLAN OF CARE: Discharge to home after PACU  PATIENT DISPOSITION:  PACU - hemodynamically stable.

## 2018-11-15 NOTE — H&P (Signed)
  Benjamin Ray is an 53 y.o. male.   Chief Complaint: catching left middle finger HPI: Benjamin Ray is a 53 yo male complaining of pain in his right wrist which has been present for a considerable period of time he localizes pain over the scapholunate ligament area. It is mild to moderate in nature with use of his hand especially radial deviation increasing his discomfort for him. Has tried ibuprofen for this. He states nothing seems to make it better or worse. No specific history of injury. He has had his left middle finger injected on 2 occasions. He has had a triggering of his right middle finger which is resolved with injections. He has a history of gout no history of diabetes thyroid problems arthritis. Family history is negative for each of these   Past Medical History:  Diagnosis Date  . Anxiety    panic attacks  . Depression   . Gout   . Hyperlipemia   . Hypertension   . Trigger finger    LMF    Past Surgical History:  Procedure Laterality Date  . MASS EXCISION Left 12/11/2013   Procedure: EXCISION MASS LEFT INDEX FINGER;  Surgeon: Nicki Reaper, MD;  Location: [facility];  Service: Orthopedics;  Laterality: Left;  . SHOULDER ARTHROSCOPY WITH ROTATOR CUFF REPAIR  2012   right  . UMBILICAL HERNIA REPAIR  2010    History reviewed. No pertinent family history. Social History:  reports that he has never smoked. He has never used smokeless tobacco. He reports current alcohol use. He reports that he does not use drugs.  Allergies: No Known Allergies  No medications prior to admission.    No results found for this or any previous visit (from the past 48 hour(s)).  No results found.   Pertinent items are noted in HPI.  Height 6' (1.829 m), weight 104.3 kg.  General appearance: alert, cooperative and appears stated age Head: Normocephalic, without obvious abnormality Neck: no JVD Resp: clear to auscultation bilaterally Cardio: regular rate and rhythm, S1, S2  normal, no murmur, click, rub or gallop GI: soft, non-tender; bowel sounds normal; no masses,  no organomegaly Extremities: catching left midde finger Pulses: 2+ and symmetric Skin: Skin color, texture, turgor normal. No rashes or lesions Neurologic: Grossly normal Incision/Wound: na  Assessment/Plan Assessment:  1. Right wrist pain  2. Trigger middle finger of left hand    Plan: He would like to have the left hand operated on. He is aware that that is not possible at the present time. After thorough prep informed consent the left middle finger A1 pulley is injected with Celestone Xylocaine. We will go ahead and schedule him for release of the A1 pulley when it becomes available to the left middle finger. Pre-peri-and postoperative course been discussed along with risks and complications. He is aware there is no guarantee to the surgery the possibility of infection recurrence injury to arteries nerves tendons complete relief symptoms dystrophy     Cindee Salt 11/15/2018, 5:28 AM

## 2018-11-16 ENCOUNTER — Encounter (HOSPITAL_BASED_OUTPATIENT_CLINIC_OR_DEPARTMENT_OTHER): Payer: Self-pay | Admitting: Orthopedic Surgery

## 2018-11-16 NOTE — Anesthesia Postprocedure Evaluation (Signed)
Anesthesia Post Note  Patient: Benjamin Ray  Procedure(s) Performed: RELEASE LEFT MIDDLE TRIGGER FINGER/A-1 PULLEY (Left Hand)     Patient location during evaluation: PACU Anesthesia Type: Bier Block Level of consciousness: awake and alert Pain management: pain level controlled Vital Signs Assessment: post-procedure vital signs reviewed and stable Respiratory status: spontaneous breathing, nonlabored ventilation, respiratory function stable and patient connected to nasal cannula oxygen Cardiovascular status: stable and blood pressure returned to baseline Postop Assessment: no apparent nausea or vomiting Anesthetic complications: no    Last Vitals:  Vitals:   11/15/18 0925 11/15/18 0937  BP: 125/83 116/78  Pulse: 78 79  Resp: 18 16  Temp:  37 C  SpO2: 98% 100%    Last Pain:  Vitals:   11/15/18 0937  TempSrc: Oral  PainSc: 0-No pain                 Phillips Grout

## 2018-12-06 ENCOUNTER — Other Ambulatory Visit: Payer: Self-pay

## 2018-12-06 ENCOUNTER — Ambulatory Visit
Admission: RE | Admit: 2018-12-06 | Discharge: 2018-12-06 | Disposition: A | Discharge status: Home / Self Care | Payer: 59 | Source: Ambulatory Visit | Attending: Orthopedic Surgery | Admitting: Orthopedic Surgery

## 2018-12-06 DIAGNOSIS — S63501A Unspecified sprain of right wrist, initial encounter: Secondary | ICD-10-CM

## 2018-12-06 IMAGING — XA FLUORO GUIDED NEEDLE PLACEMENT AND/OR ASPIRATION
5 series · 7 of 7 positions shown · non-contrast
Comparison: none

CLINICAL DATA: Right wrist pain and locking.

[Series 1: ortho standard · 1 of 1 slices shown (1 of 5)]
[im 1/1]
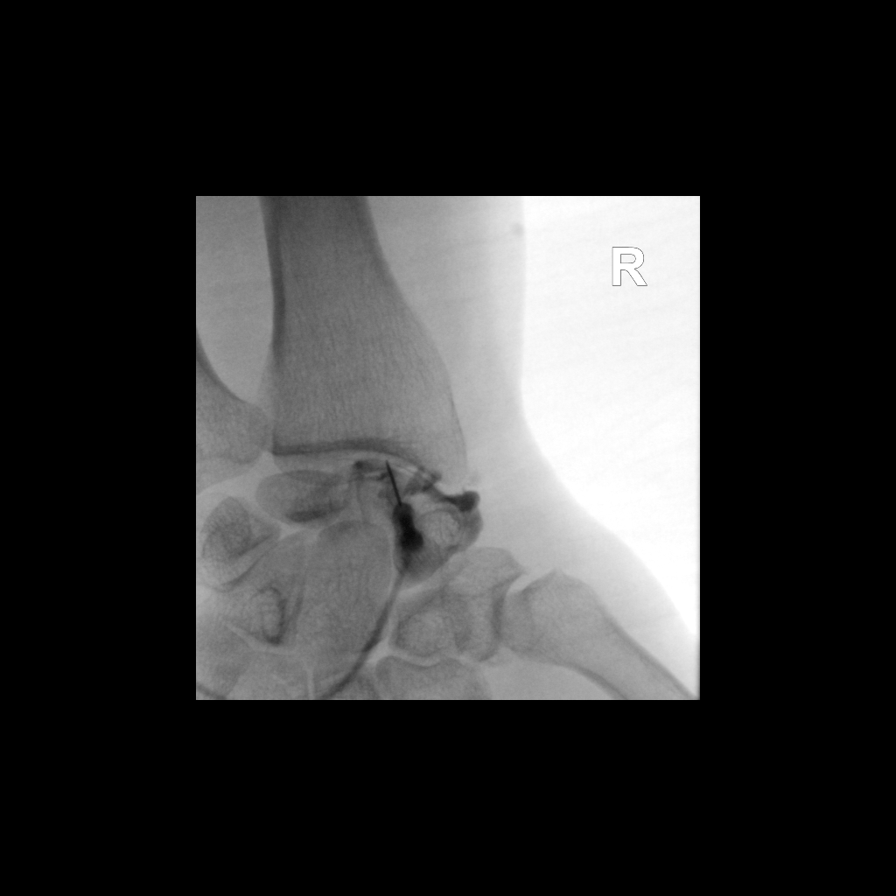

[Series 2: ortho standard · 1 of 1 slices shown (2 of 5)]
[im 1/1]
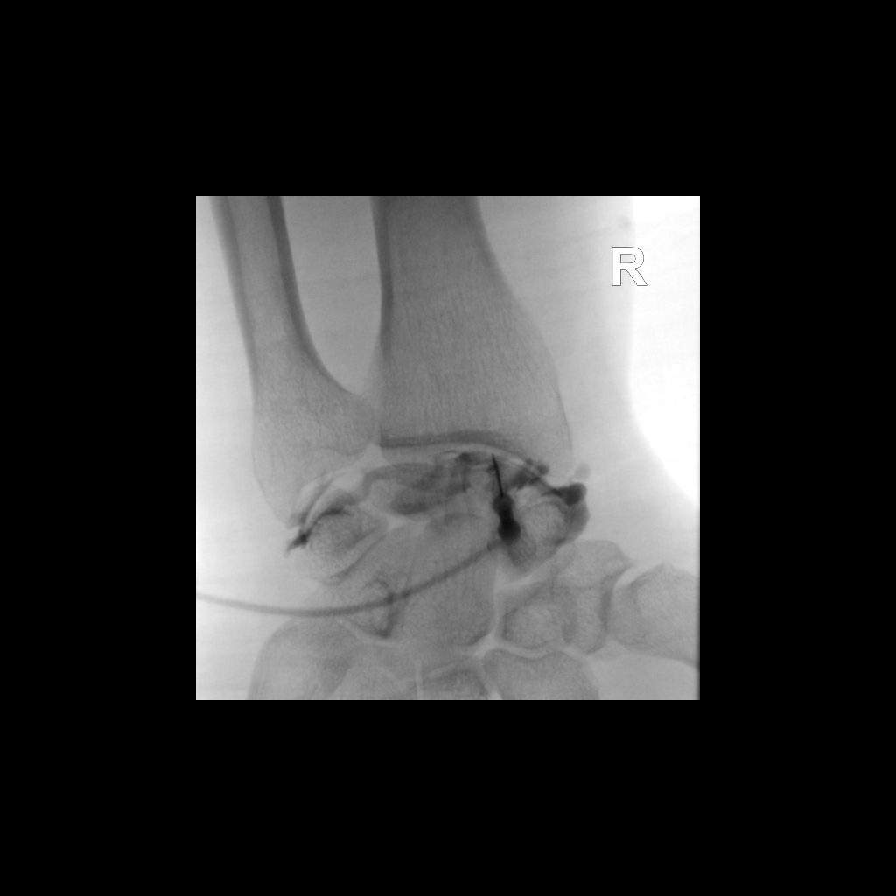

[Series 3: ortho standard · 1 of 1 slices shown (3 of 5)]
[im 1/1]
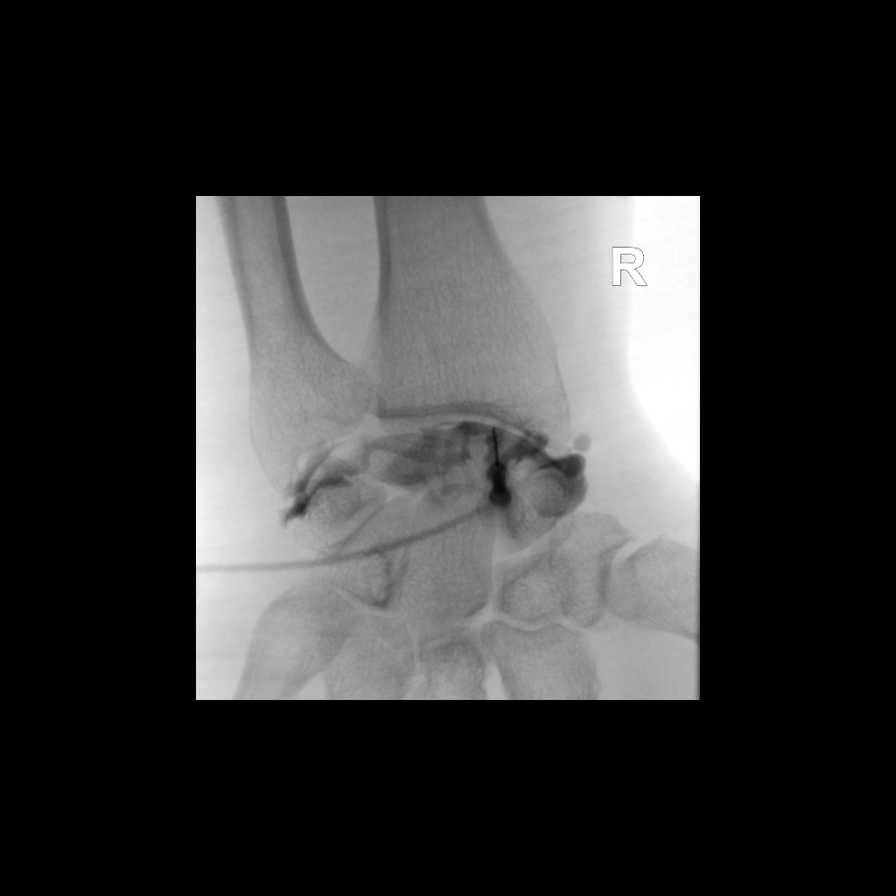

[Series 5: ortho standard · 2 of 2 slices shown (4 of 5)]
[im 1/2]
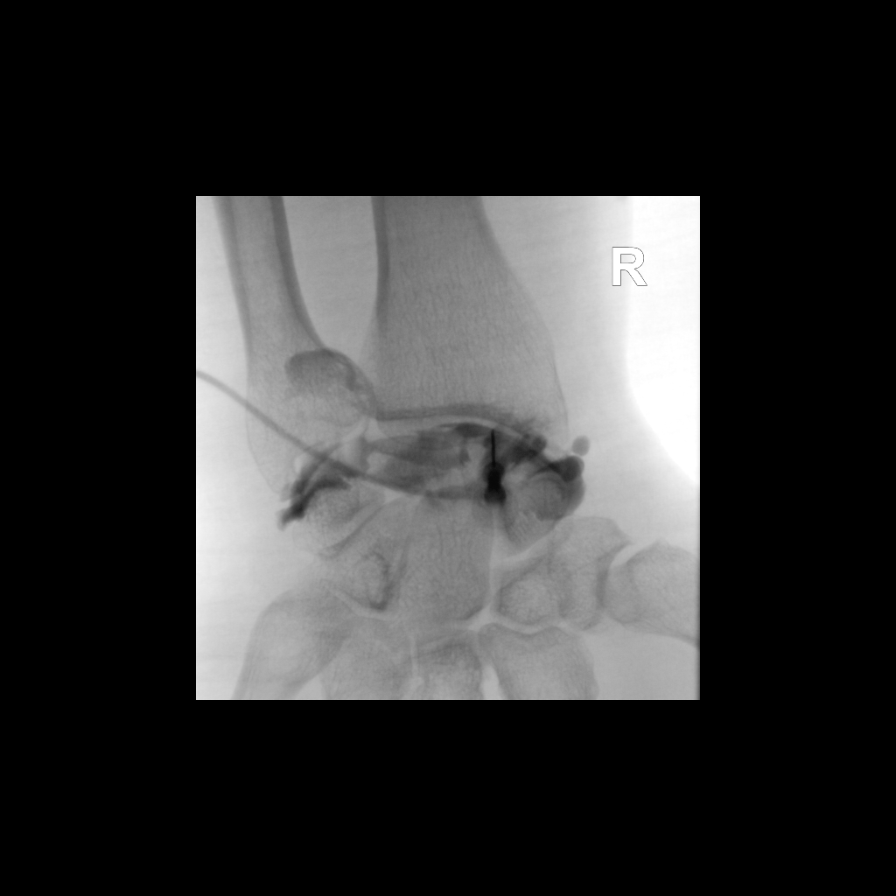
[im 2/2]
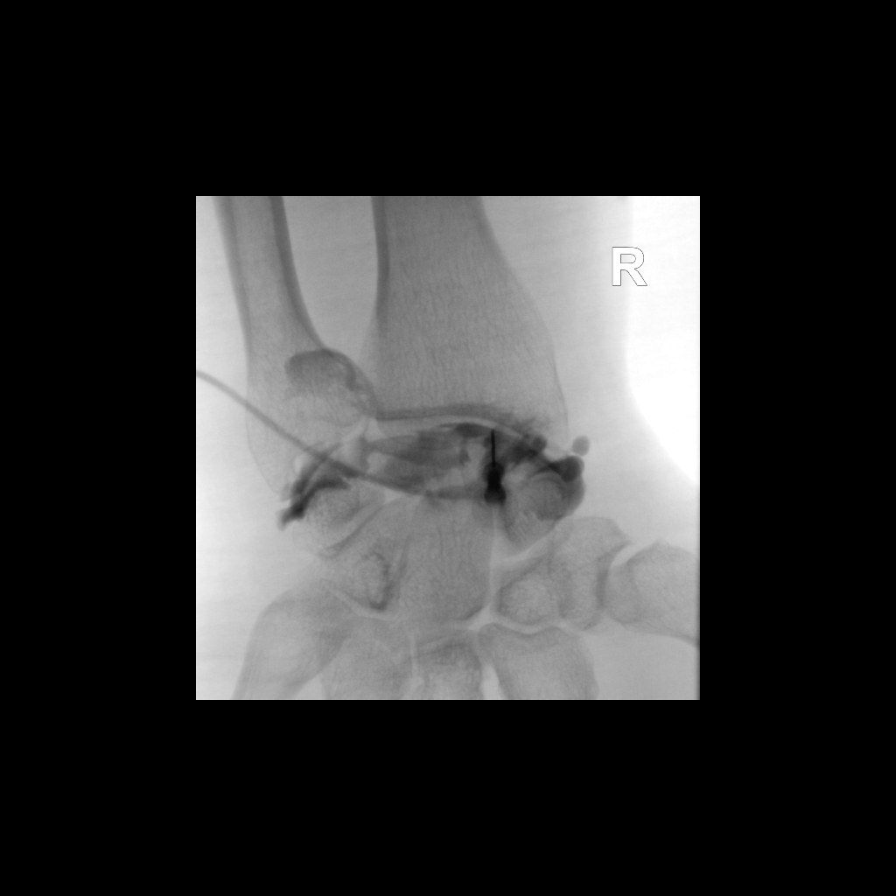

[Series 6: ortho standard · 2 of 2 slices shown (5 of 5)]
[im 1/2]
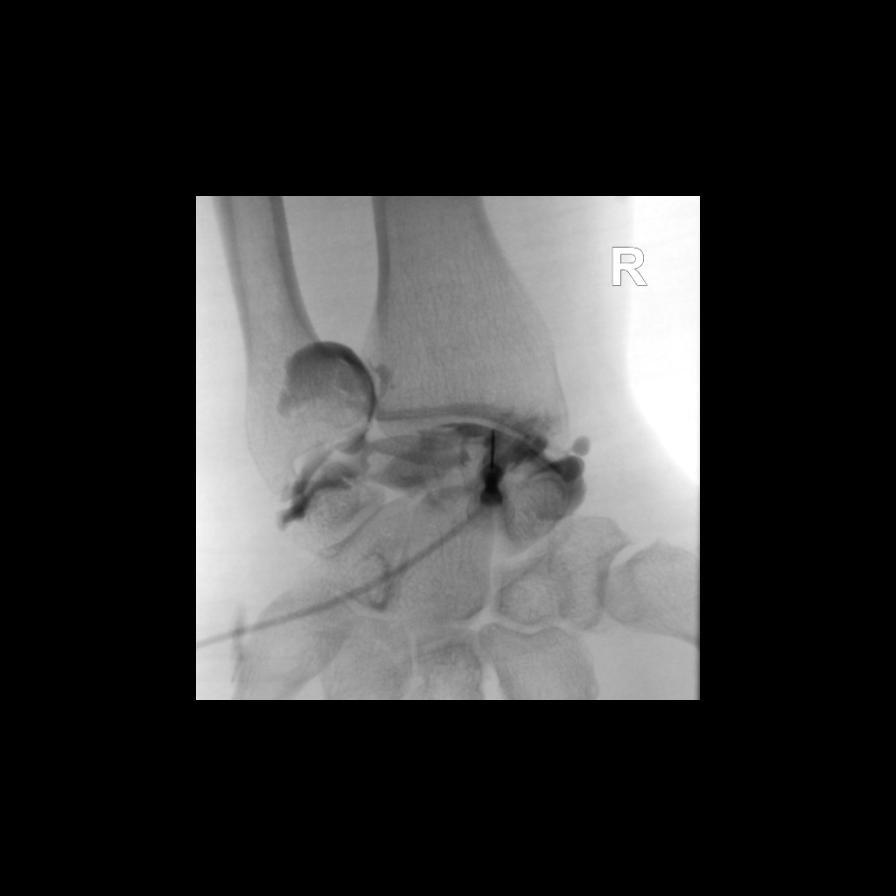
[im 2/2]
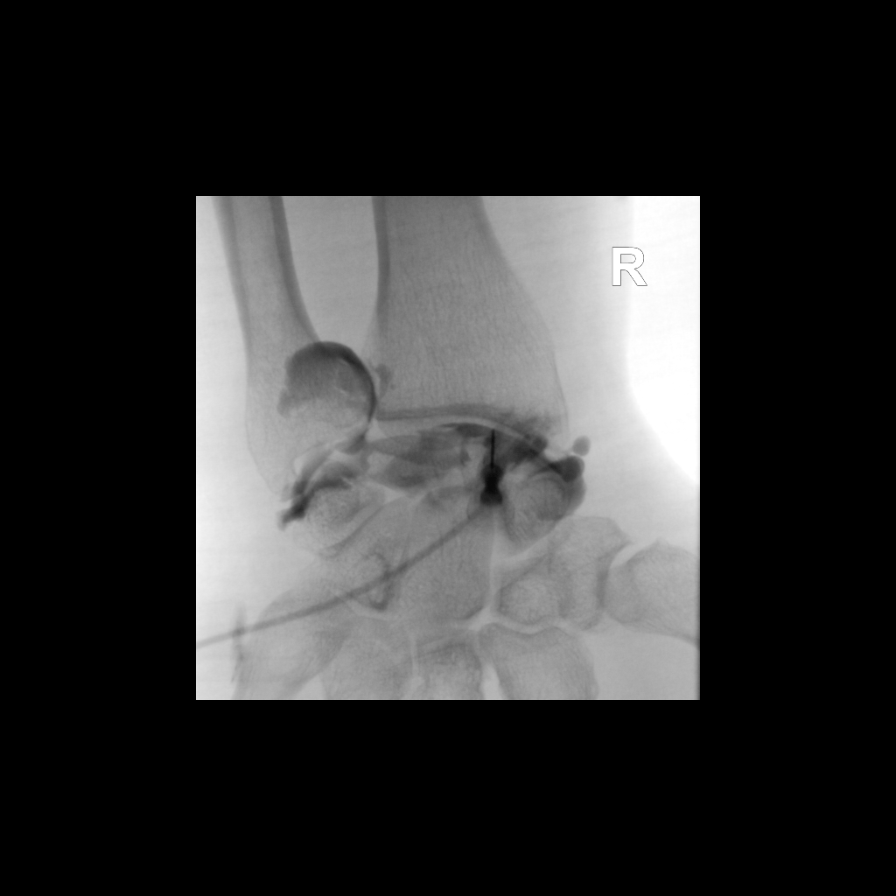

[7 of 7 positions shown; findings below may reference images not displayed]

FLUOROSCOPY TIME:  0 minutes 16 seconds. 3.30 micro gray meter
squared

PROCEDURE:
Right RADIOCARPAL JOINT INJECTION UNDER FLUOROSCOPY

The skin dorsal to the wrist was scrubbed with Betadine and draped
in sterile fashion. Skin anesthesia was carried out using!%
Lidocaine. A 25 NUNO needle was directed into the radiocarpal joint
under flouroscopic guidance. 2cc of a mixture of 0.1 ml Multihance
and 10 ml of dilute Isovue 200 was then used to fill the radiocarpal
joint. Filming showed triangular fibrocartilage tear.
IMPRESSION: Technically successful right radiocarpal joint injection for MRI.
Preliminary filming shows triangular fibrocartilage tear. No
evidence of proximal row intercarpal ligament tear.

## 2018-12-06 IMAGING — MR MRI OF THE RIGHT WRIST WITH CONTRAST
7 series · 40 of 40 positions shown · IV contrast (agent unspecified)
Comparison: None.

CONTRAST:  See injection documentation.

CLINICAL DATA: Right wrist pain.  No injury.

EXAM:
MR OF THE RIGHT WRIST WITH CONTRAST (MR ARTHROGRAM)
TECHNIQUE: Multiplanar, multisequence MR imaging of the right wrist was
performed following the administration of intra-articular contrast.

[Series 3: T2 fat-sat · axial · right · 3.0mm · 0.28mm/px · z∈[-27,+46]mm · 9 of 25 slices shown (1 of 2)]
[im 1/25]
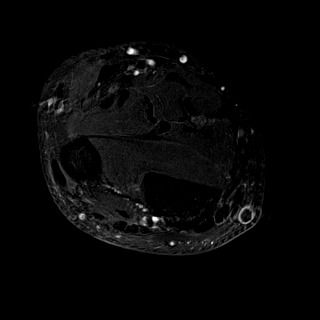
[im 4/25]
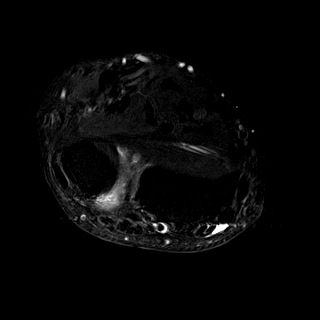
[im 7/25]
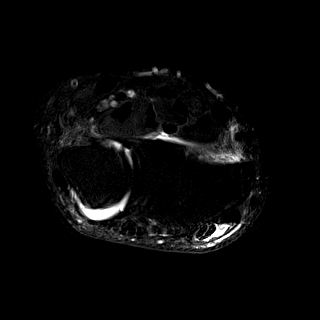
[im 10/25]
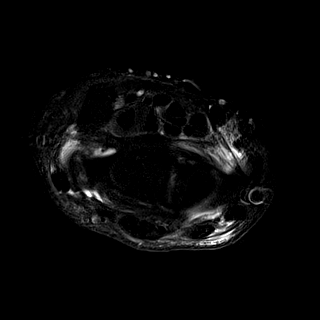
[im 13/25]
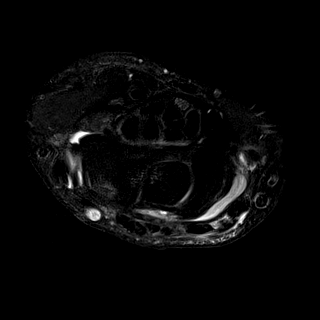
[im 16/25]
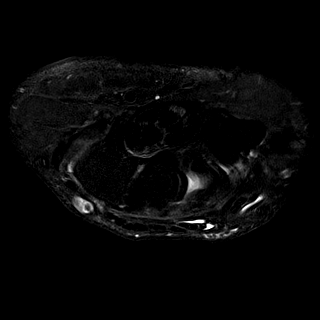
[im 19/25]
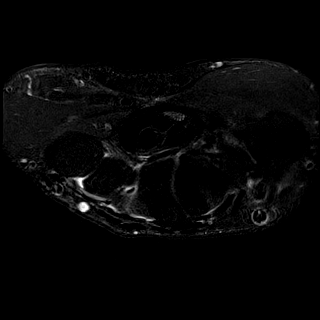
[im 22/25]
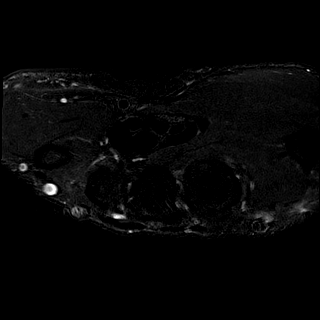
[im 25/25]
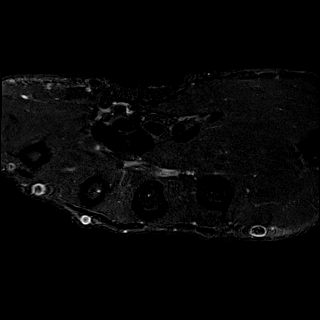

[Series 4: T1 fat-sat · axial · non-contrast · right · 3.0mm · 0.31mm/px · z∈[-26,+48]mm · 9 of 25 slices shown (1 of 3)]
[im 1/25]
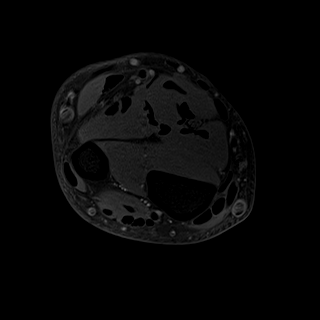
[im 4/25]
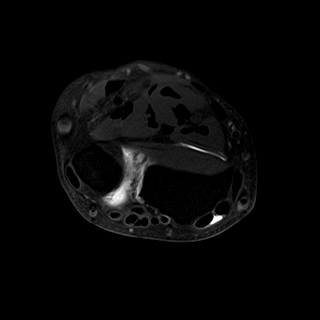
[im 7/25]
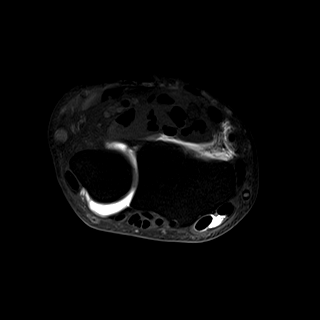
[im 10/25]
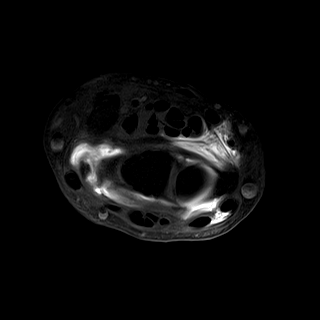
[im 13/25]
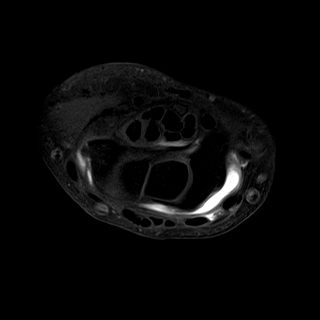
[im 16/25]
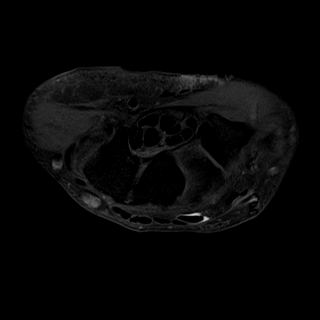
[im 19/25]
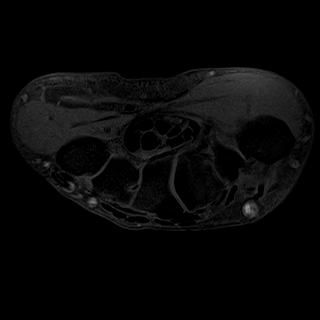
[im 22/25]
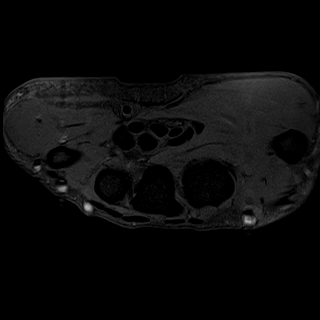
[im 25/25]
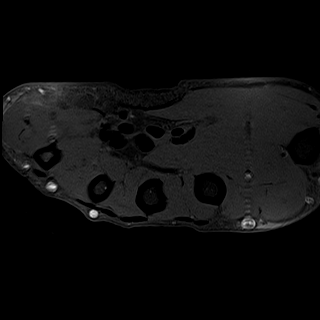

[Series 5: T1 · coronal · right · 3.0mm · 0.23mm/px · 4 of 11 slices shown (1 of 2)]
[im 1/11]
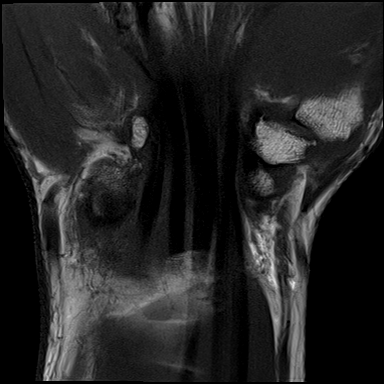
[im 4/11]
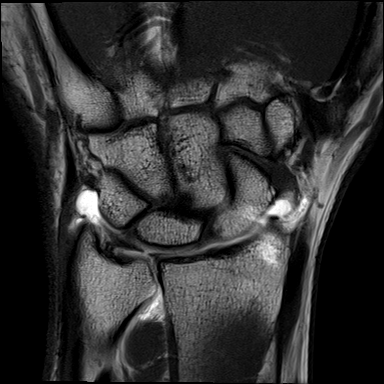
[im 7/11]
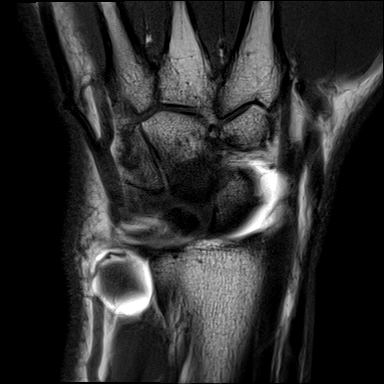
[im 11/11]
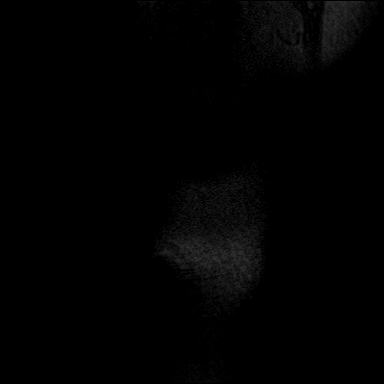

[Series 6: T1 fat-sat · coronal · right · 3.0mm · 0.31mm/px · 4 of 11 slices shown (2 of 3)]
[im 1/11]
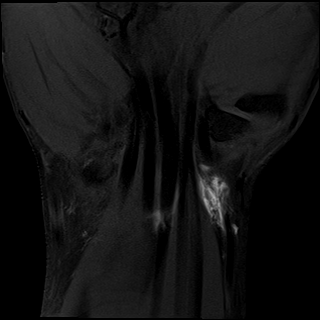
[im 4/11]
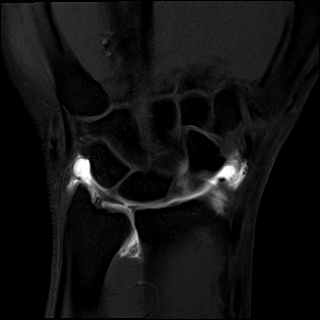
[im 7/11]
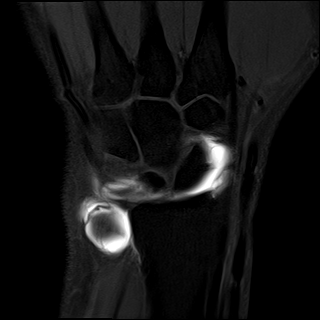
[im 11/11]
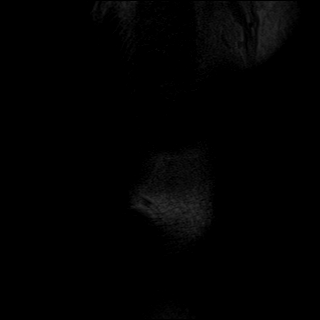

[Series 7: T1 · coronal · right · 3.0mm · 0.31mm/px · 4 of 11 slices shown (2 of 2)]
[im 1/11]
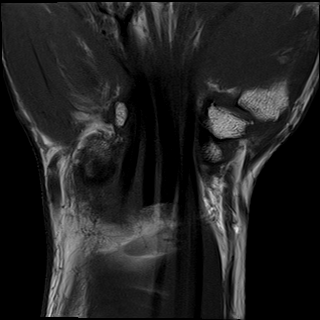
[im 4/11]
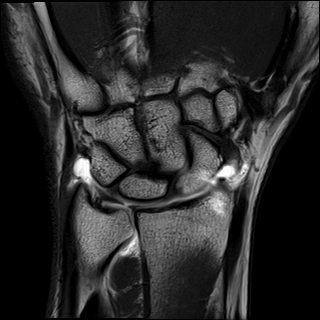
[im 7/11]
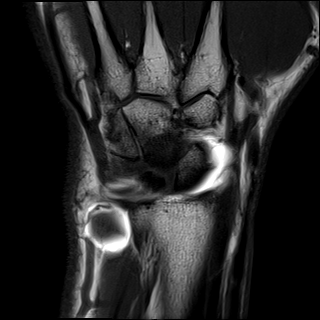
[im 11/11]
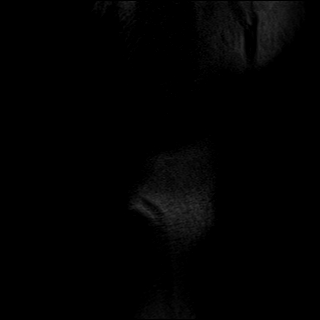

[Series 8: T2 fat-sat · coronal · right · 3.0mm · 0.31mm/px · 4 of 11 slices shown (2 of 2)]
[im 1/11]
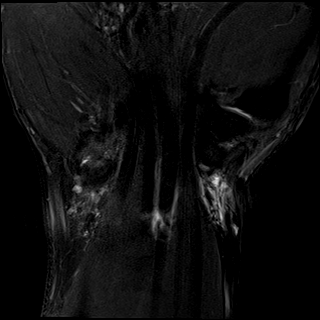
[im 4/11]
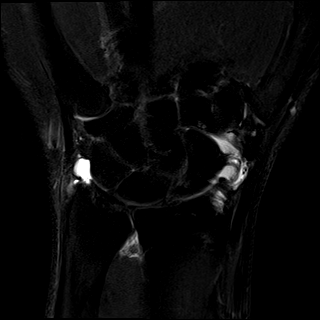
[im 7/11]
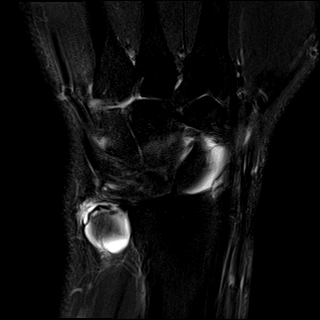
[im 11/11]
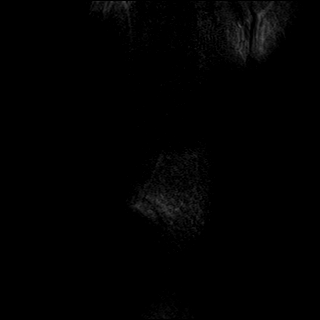

[Series 9: T1 fat-sat · sagittal · right · 3.0mm · 0.31mm/px · 6 of 17 slices shown (3 of 3)]
[im 1/17]
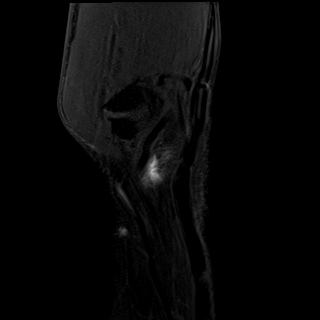
[im 4/17]
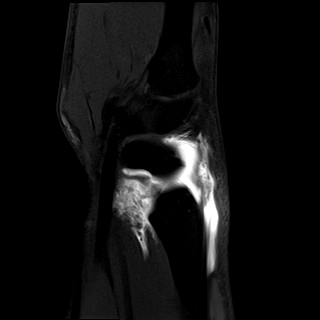
[im 7/17]
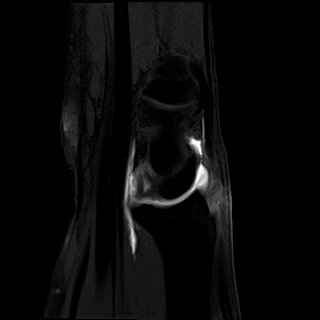
[im 10/17]
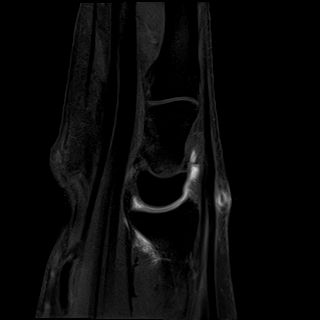
[im 13/17]
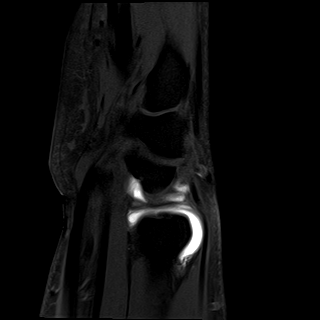
[im 17/17]
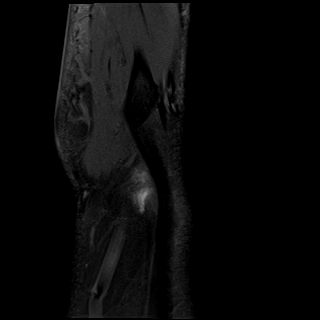

[40 of 40 positions shown; findings below may reference images not displayed]

FINDINGS: Ligaments: Intact scapholunate and lunotriquetral ligaments.

Triangular fibrocartilage: Complete tear through the central
articular disc.

Tendons: Intact flexor and extensor compartment tendons.

Carpal tunnel/median nerve: Normal carpal tunnel. Normal median
nerve.

Guyon's canal: Normal.

Joint/cartilage: The radiocarpal joint is distended with
intra-articular contrast, which extends into the distal radioulnar
joint. Tiny areas of degenerative subchondral marrow edema and
cystic changes in the proximal hamate, proximal capitate, and
proximal trapezoid. No chondral defect.

Bones/carpal alignment: No marrow signal abnormality. Normal
alignment. No aggressive osseous lesion.

Other: No fluid collection or hematoma.
IMPRESSION: 1. Complete tear through the TFCC central articular disc.

## 2018-12-06 MED ORDER — IOPAMIDOL (ISOVUE-M 200) INJECTION 41%
2.0000 mL | Freq: Once | INTRAMUSCULAR | Status: AC
  Administered 2018-12-06: 11:00:00 2 mL via INTRA_ARTICULAR

## 2018-12-13 ENCOUNTER — Other Ambulatory Visit: Payer: Self-pay | Admitting: Orthopedic Surgery

## 2019-01-05 ENCOUNTER — Encounter (HOSPITAL_BASED_OUTPATIENT_CLINIC_OR_DEPARTMENT_OTHER): Payer: Self-pay

## 2019-01-05 ENCOUNTER — Other Ambulatory Visit: Payer: Self-pay

## 2019-01-09 ENCOUNTER — Other Ambulatory Visit (HOSPITAL_COMMUNITY)
Admission: RE | Admit: 2019-01-09 | Discharge: 2019-01-09 | Disposition: A | Discharge status: Home / Self Care | Payer: 59 | Source: Ambulatory Visit | Attending: Orthopedic Surgery | Admitting: Orthopedic Surgery

## 2019-01-09 DIAGNOSIS — Z1159 Encounter for screening for other viral diseases: Secondary | ICD-10-CM | POA: Diagnosis present

## 2019-01-09 LAB — SARS CORONAVIRUS 2 (TAT 6-24 HRS): SARS Coronavirus 2: NEGATIVE

## 2019-01-09 NOTE — Progress Notes (Signed)
Patient given Ensure presurgery with instructions to drink by 0800 DOS. Patient verbalized understanding without questions.

## 2019-01-12 ENCOUNTER — Other Ambulatory Visit: Payer: Self-pay

## 2019-01-12 ENCOUNTER — Ambulatory Visit (HOSPITAL_BASED_OUTPATIENT_CLINIC_OR_DEPARTMENT_OTHER): Payer: 59 | Admitting: Anesthesiology

## 2019-01-12 ENCOUNTER — Ambulatory Visit (HOSPITAL_BASED_OUTPATIENT_CLINIC_OR_DEPARTMENT_OTHER)
Admission: RE | Admit: 2019-01-12 | Discharge: 2019-01-12 | Disposition: A | Discharge status: Home / Self Care | Payer: 59 | Attending: Orthopedic Surgery | Admitting: Orthopedic Surgery

## 2019-01-12 ENCOUNTER — Encounter (HOSPITAL_BASED_OUTPATIENT_CLINIC_OR_DEPARTMENT_OTHER): Payer: Self-pay | Admitting: Anesthesiology

## 2019-01-12 ENCOUNTER — Encounter (HOSPITAL_BASED_OUTPATIENT_CLINIC_OR_DEPARTMENT_OTHER)
Admission: RE | Disposition: A | Discharge status: Home / Self Care | Payer: Self-pay | Source: Home / Self Care | Attending: Orthopedic Surgery

## 2019-01-12 DIAGNOSIS — Z6835 Body mass index (BMI) 35.0-35.9, adult: Secondary | ICD-10-CM | POA: Insufficient documentation

## 2019-01-12 DIAGNOSIS — S63591A Other specified sprain of right wrist, initial encounter: Secondary | ICD-10-CM | POA: Diagnosis not present

## 2019-01-12 DIAGNOSIS — I10 Essential (primary) hypertension: Secondary | ICD-10-CM | POA: Insufficient documentation

## 2019-01-12 DIAGNOSIS — E669 Obesity, unspecified: Secondary | ICD-10-CM | POA: Diagnosis not present

## 2019-01-12 DIAGNOSIS — X58XXXA Exposure to other specified factors, initial encounter: Secondary | ICD-10-CM | POA: Diagnosis not present

## 2019-01-12 DIAGNOSIS — M13831 Other specified arthritis, right wrist: Secondary | ICD-10-CM | POA: Insufficient documentation

## 2019-01-12 HISTORY — PX: WRIST ARTHROSCOPY WITH DEBRIDEMENT: SHX6194

## 2019-01-12 SURGERY — WRIST ARTHROSCOPY WITH DEBRIDEMENT
Anesthesia: Monitor Anesthesia Care | Site: Wrist | Laterality: Right

## 2019-01-12 MED ORDER — DEXAMETHASONE SODIUM PHOSPHATE 10 MG/ML IJ SOLN
INTRAMUSCULAR | Status: AC
  Filled 2019-01-12: qty 1

## 2019-01-12 MED ORDER — CEFAZOLIN SODIUM-DEXTROSE 2-4 GM/100ML-% IV SOLN: 2.0000 g | INTRAVENOUS | Status: DC

## 2019-01-12 MED ORDER — CEFAZOLIN SODIUM-DEXTROSE 2-4 GM/100ML-% IV SOLN
INTRAVENOUS | Status: AC
  Filled 2019-01-12: qty 100

## 2019-01-12 MED ORDER — BUPIVACAINE-EPINEPHRINE (PF) 0.5% -1:200000 IJ SOLN
INTRAMUSCULAR | Status: DC | PRN
  Administered 2019-01-12: 30 mL via PERINEURAL

## 2019-01-12 MED ORDER — FENTANYL CITRATE (PF) 100 MCG/2ML IJ SOLN
INTRAMUSCULAR | Status: AC
  Filled 2019-01-12: qty 2

## 2019-01-12 MED ORDER — MIDAZOLAM HCL 2 MG/2ML IJ SOLN
INTRAMUSCULAR | Status: AC
  Filled 2019-01-12: qty 2

## 2019-01-12 MED ORDER — FENTANYL CITRATE (PF) 100 MCG/2ML IJ SOLN
50.0000 ug | INTRAMUSCULAR | Status: DC | PRN
  Administered 2019-01-12: 50 ug via INTRAVENOUS

## 2019-01-12 MED ORDER — CHLORHEXIDINE GLUCONATE 4 % EX LIQD: 60.0000 mL | Freq: Once | CUTANEOUS | Status: DC

## 2019-01-12 MED ORDER — HYDROCODONE-ACETAMINOPHEN 7.5-325 MG PO TABS: 1.0000 | ORAL_TABLET | Freq: Once | ORAL | Status: DC | PRN

## 2019-01-12 MED ORDER — PROPOFOL 500 MG/50ML IV EMUL
INTRAVENOUS | Status: DC | PRN
  Administered 2019-01-12: 75 ug/kg/min via INTRAVENOUS

## 2019-01-12 MED ORDER — MIDAZOLAM HCL 2 MG/2ML IJ SOLN
1.0000 mg | INTRAMUSCULAR | Status: DC | PRN
  Administered 2019-01-12 (×2): 1 mg via INTRAVENOUS
  Administered 2019-01-12: 2 mg via INTRAVENOUS

## 2019-01-12 MED ORDER — ONDANSETRON HCL 4 MG/2ML IJ SOLN
INTRAMUSCULAR | Status: DC | PRN
  Administered 2019-01-12: 4 mg via INTRAVENOUS

## 2019-01-12 MED ORDER — PROPOFOL 10 MG/ML IV BOLUS
INTRAVENOUS | Status: AC
  Filled 2019-01-12: qty 40

## 2019-01-12 MED ORDER — ONDANSETRON HCL 4 MG/2ML IJ SOLN
INTRAMUSCULAR | Status: AC
  Filled 2019-01-12: qty 2

## 2019-01-12 MED ORDER — HYDROCODONE-ACETAMINOPHEN 5-325 MG PO TABS: 1.0000 | ORAL_TABLET | Freq: Four times a day (QID) | ORAL | 0 refills | Status: DC | PRN

## 2019-01-12 MED ORDER — ONDANSETRON HCL 4 MG/2ML IJ SOLN: 4.0000 mg | Freq: Once | INTRAMUSCULAR | Status: DC | PRN

## 2019-01-12 MED ORDER — LIDOCAINE HCL (PF) 1 % IJ SOLN
INTRAMUSCULAR | Status: AC
  Filled 2019-01-12: qty 5

## 2019-01-12 MED ORDER — FENTANYL CITRATE (PF) 100 MCG/2ML IJ SOLN: 25.0000 ug | INTRAMUSCULAR | Status: DC | PRN

## 2019-01-12 MED ORDER — LACTATED RINGERS IV SOLN
INTRAVENOUS | Status: DC
  Administered 2019-01-12: 11:00:00 via INTRAVENOUS

## 2019-01-12 MED ORDER — LIDOCAINE 2% (20 MG/ML) 5 ML SYRINGE
INTRAMUSCULAR | Status: AC
  Filled 2019-01-12: qty 5

## 2019-01-12 MED ORDER — MEPERIDINE HCL 25 MG/ML IJ SOLN: 6.2500 mg | INTRAMUSCULAR | Status: DC | PRN

## 2019-01-12 SURGICAL SUPPLY — 76 items
BAG DECANTER FOR FLEXI CONT (MISCELLANEOUS) IMPLANT
BLADE EAR TYMPAN 2.5 60D BEAV (BLADE) IMPLANT
BLADE MINI RND TIP GREEN BEAV (BLADE) IMPLANT
BLADE SURG 15 STRL LF DISP TIS (BLADE) ×4 IMPLANT
BLADE SURG 15 STRL SS (BLADE) ×4
BNDG COHESIVE 1X5 TAN STRL LF (GAUZE/BANDAGES/DRESSINGS) IMPLANT
BNDG COHESIVE 3X5 TAN STRL LF (GAUZE/BANDAGES/DRESSINGS) ×4 IMPLANT
BNDG ESMARK 4X9 LF (GAUZE/BANDAGES/DRESSINGS) IMPLANT
BNDG GAUZE ELAST 4 BULKY (GAUZE/BANDAGES/DRESSINGS) ×4 IMPLANT
CANISTER SUCT 1200ML W/VALVE (MISCELLANEOUS) IMPLANT
CHLORAPREP W/TINT 26 (MISCELLANEOUS) ×4 IMPLANT
CORD BIPOLAR FORCEPS 12FT (ELECTRODE) ×4 IMPLANT
COVER BACK TABLE REUSABLE LG (DRAPES) ×4 IMPLANT
COVER MAYO STAND REUSABLE (DRAPES) ×4 IMPLANT
COVER WAND RF STERILE (DRAPES) IMPLANT
CUFF TOURN SGL QUICK 18X4 (TOURNIQUET CUFF) IMPLANT
DRAPE EXTREMITY T 121X128X90 (DISPOSABLE) ×4 IMPLANT
DRAPE IMP U-DRAPE 54X76 (DRAPES) ×4 IMPLANT
DRAPE OEC MINIVIEW 54X84 (DRAPES) IMPLANT
DRAPE SURG 17X23 STRL (DRAPES) ×4 IMPLANT
ELECT SMALL JOINT 90D BASC (ELECTRODE) IMPLANT
GAUZE PACKING IODOFORM 1/4X15 (GAUZE/BANDAGES/DRESSINGS) IMPLANT
GAUZE SPONGE 4X4 12PLY STRL (GAUZE/BANDAGES/DRESSINGS) ×4 IMPLANT
GAUZE XEROFORM 1X8 LF (GAUZE/BANDAGES/DRESSINGS) ×4 IMPLANT
GLOVE BIOGEL PI IND STRL 8.5 (GLOVE) ×2 IMPLANT
GLOVE BIOGEL PI INDICATOR 8.5 (GLOVE) ×2
GLOVE SURG ORTHO 8.0 STRL STRW (GLOVE) ×4 IMPLANT
GOWN STRL REUS W/ TWL LRG LVL3 (GOWN DISPOSABLE) ×2 IMPLANT
GOWN STRL REUS W/TWL LRG LVL3 (GOWN DISPOSABLE) ×2
GOWN STRL REUS W/TWL XL LVL3 (GOWN DISPOSABLE) ×4 IMPLANT
IV NS IRRIG 3000ML ARTHROMATIC (IV SOLUTION) ×4 IMPLANT
IV SET EXT 30 76VOL 4 MALE LL (IV SETS) ×4 IMPLANT
LOOP VESSEL MAXI BLUE (MISCELLANEOUS) IMPLANT
MANIFOLD NEPTUNE II (INSTRUMENTS) IMPLANT
NDL SAFETY ECLIPSE 18X1.5 (NEEDLE) ×6 IMPLANT
NEEDLE HYPO 18GX1.5 SHARP (NEEDLE) ×6
NEEDLE HYPO 22GX1.5 SAFETY (NEEDLE) ×4 IMPLANT
NEEDLE PRECISIONGLIDE 27X1.5 (NEEDLE) IMPLANT
NEEDLE SPNL 18GX3.5 QUINCKE PK (NEEDLE) IMPLANT
NEEDLE TUOHY 20GX3.5 (NEEDLE) IMPLANT
NS IRRIG 1000ML POUR BTL (IV SOLUTION) ×4 IMPLANT
PACK BASIN DAY SURGERY FS (CUSTOM PROCEDURE TRAY) ×4 IMPLANT
PAD CAST 3X4 CTTN HI CHSV (CAST SUPPLIES) ×2 IMPLANT
PADDING CAST ABS 3INX4YD NS (CAST SUPPLIES) ×2
PADDING CAST ABS 4INX4YD NS (CAST SUPPLIES) ×2
PADDING CAST ABS COTTON 3X4 (CAST SUPPLIES) ×2 IMPLANT
PADDING CAST ABS COTTON 4X4 ST (CAST SUPPLIES) ×2 IMPLANT
PADDING CAST COTTON 3X4 STRL (CAST SUPPLIES) ×2
SET SM JOINT TUBING/CANN (CANNULA) IMPLANT
SHAVER DISSECTOR 3.0 (BURR) IMPLANT
SHAVER SABRE 2.0 (BURR) IMPLANT
SLEEVE SCD COMPRESS KNEE MED (MISCELLANEOUS) IMPLANT
SLING ARM FOAM STRAP LRG (SOFTGOODS) ×4 IMPLANT
SPLINT PLASTER CAST XFAST 3X15 (CAST SUPPLIES) IMPLANT
SPLINT PLASTER XTRA FASTSET 3X (CAST SUPPLIES)
STOCKINETTE 4X48 STRL (DRAPES) ×4 IMPLANT
SUCTION FRAZIER HANDLE 10FR (MISCELLANEOUS)
SUCTION TUBE FRAZIER 10FR DISP (MISCELLANEOUS) IMPLANT
SUT ETHILON 4 0 PS 2 18 (SUTURE) ×4 IMPLANT
SUT MERSILENE 4 0 P 3 (SUTURE) IMPLANT
SUT PDS AB 2-0 CT2 27 (SUTURE) IMPLANT
SUT STEEL 4 0 (SUTURE) IMPLANT
SUT VIC AB 2-0 PS2 27 (SUTURE) IMPLANT
SUT VICRYL 4-0 PS2 18IN ABS (SUTURE) IMPLANT
SWAB COLLECTION DEVICE MRSA (MISCELLANEOUS) IMPLANT
SWAB CULTURE ESWAB REG 1ML (MISCELLANEOUS) IMPLANT
SYR BULB 3OZ (MISCELLANEOUS) ×4 IMPLANT
SYR CONTROL 10ML LL (SYRINGE) ×4 IMPLANT
TOWEL GREEN STERILE FF (TOWEL DISPOSABLE) ×8 IMPLANT
TUBE CONNECTING 20'X1/4 (TUBING) ×1
TUBE CONNECTING 20X1/4 (TUBING) ×3 IMPLANT
TUBE FEEDING ENTERAL 5FR 16IN (TUBING) IMPLANT
TUBING ARTHROSCOPY IRRIG 16FT (MISCELLANEOUS) IMPLANT
UNDERPAD 30X30 (UNDERPADS AND DIAPERS) ×4 IMPLANT
WAND SHORT BEVEL W/CORD (SURGICAL WAND) IMPLANT
WATER STERILE IRR 1000ML POUR (IV SOLUTION) ×4 IMPLANT

## 2019-01-12 NOTE — Op Note (Signed)
I assisted Surgeon(s) and Role:    * Daryll Brod, MD - Primary    * Leanora Cover, MD - Assisting on the Procedure(s): WRIST ARTHROSCOPY WITH DEBRIDEMENT OF [place] on 01/12/2019.  I provided assistance on this case as follows: Set up and management of arthroscopic equipment.  Electronically signed by: Leanora Cover, MD Date: 01/12/2019 Time: 4:08 PM

## 2019-01-12 NOTE — Transfer of Care (Signed)
Immediate Anesthesia Transfer of Care Note  Patient: Benjamin Ray  Procedure(s) Performed: WRIST ARTHROSCOPY WITH DEBRIDEMENT OF TRIANGULAR FIBROCARTILEGE COMPLEX  TEAR (Right Wrist)  Patient Location: PACU  Anesthesia Type:MAC and Regional  Level of Consciousness: awake, alert  and oriented  Airway & Oxygen Therapy: Patient Spontanous Breathing and Patient connected to face mask oxygen  Post-op Assessment: Report given to RN and Post -op Vital signs reviewed and stable  Post vital signs: Reviewed and stable  Last Vitals:  Vitals Value Taken Time  BP    Temp    Pulse 70 01/12/19 1338  Resp 18 01/12/19 1338  SpO2 89 % 01/12/19 1338  Vitals shown include unvalidated device data.  Last Pain:  Vitals:   01/12/19 1030  TempSrc: Oral  PainSc: 0-No pain         Complications: No apparent anesthesia complications

## 2019-01-12 NOTE — H&P (Signed)
Benjamin Ray is an 53 y.o. male.   Chief Complaint: Right wrist pain  HFW:YOVZC is a 53 yo male complaining of pain in his right wrist which has been present for a considerable period of time he localizes pain over the scapholunate ligament area. It is mild to moderate in nature with use of his hand especially radial deviation increasing his discomfort for him. Has tried ibuprofen for this. He states nothing seems to make it better or worse. No specific history of injury.  He was sent for arthrographic MRI to rule out TFCC injury of possible scapholunate ligament injury. This is been done read by Dr. Nelia Shi with a TFCC tear. He has a history of gout no history of diabetes thyroid problems arthritis. His family history is negative for each.       Past Medical History:  Diagnosis Date  . Anxiety    panic attacks  . Depression   . Gout   . Hyperlipemia   . Hypertension   . Trigger finger    LMF    Past Surgical History:  Procedure Laterality Date  . MASS EXCISION Left 12/11/2013   Procedure: EXCISION MASS LEFT INDEX FINGER;  Surgeon: Wynonia Sours, MD;  Location: [facility];  Service: Orthopedics;  Laterality: Left;  . SHOULDER ARTHROSCOPY WITH ROTATOR CUFF REPAIR  2012   right  . TRIGGER FINGER RELEASE Left 11/15/2018   Procedure: RELEASE LEFT MIDDLE TRIGGER FINGER/A-1 PULLEY;  Surgeon: Daryll Brod, MD;  Location: [facility];  Service: Orthopedics;  Laterality: Left;  . UMBILICAL HERNIA REPAIR  2010    History reviewed. No pertinent family history. Social History:  reports that he has never smoked. He has never used smokeless tobacco. He reports current alcohol use. He reports that he does not use drugs.  Allergies: No Known Allergies  No medications prior to admission.    No results found for this or any previous visit (from the past 48 hour(s)).  No results found.   Pertinent items are noted in HPI.  Height 6' (1.829 m), weight 104.3  kg.  General appearance: alert, cooperative and appears stated age Head: Normocephalic, without obvious abnormality Neck: no JVD Resp: clear to auscultation bilaterally Cardio: regular rate and rhythm, S1, S2 normal, no murmur, click, rub or gallop GI: soft, non-tender; bowel sounds normal; no masses,  no organomegaly Extremities: Pain right wrist Pulses: 2+ and symmetric Skin: Skin color, texture, turgor normal. No rashes or lesions Neurologic: Grossly normal Incision/Wound: na  Assessment/Plan Assessment:  1. Injury of triangular fibrocartilage complex (TFCC) of right wrist   Plan: We have discussed arthroscopic inspection with him. Pre-peri-postoperative course are discussed along with risk and complications. We will also look for any ligamentous injury with debridement TFCC. Would not recommend any treatment of the distal ulna if it is long at the present time. Pre-peri-and postoperative course are discussed with him. To the schedule as an outpatient under regional anesthesia.   Daryll Brod 01/12/2019, 9:43 AM

## 2019-01-12 NOTE — Op Note (Signed)
NAME: Benjamin Ray MEDICAL RECORD NO: [id number] DATE OF BIRTH: 28-Dec-1965 [facility] LOCATION: [facility] PHYSICIAN: Nicki ReaperGARY R. Quamaine Webb, MD   OPERATIVE REPORT   DATE OF PROCEDURE: 01/12/19    PREOPERATIVE DIAGNOSIS:   Wrist pain right wrist TFCC tear   POSTOPERATIVE DIAGNOSIS:   Same plus arthritic change proximal hamate stretching of the scapholunate ligament complex      PROCEDURE:   Arthroscopy right wrist with debridement TFCC debridement of proximal hamate  SURGEON: Cindee SaltGary Dondrea Clendenin, M.D.   ASSISTANT: Betha LoaKevin Dael Howland, MD   ANESTHESIA:  Regional with sedation   INTRAVENOUS FLUIDS:  Per anesthesia flow sheet.   ESTIMATED BLOOD LOSS:  Minimal.   COMPLICATIONS:  None.   SPECIMENS:  none   TOURNIQUET TIME:   * Missing tourniquet times found for documented tourniquets in log: [id number] *   DISPOSITION:  Stable to PACU.   INDICATIONS: Patient is a 53 year old male with a history of ulnar-sided wrist pain.  Arthroscopy is been recommended after arthrographic MRI revealed TFCC tear.  He does have some complaint of discomfort on the radial aspect of his wrist in addition.  Pre-peri-and postoperative course been discussed along with risks and complications.  He is aware there is no guarantee to the surgery the possibility of infection recurrence injury to arteries nerves tendons complete relief symptoms symptoms and dystrophy the possibility of further intervention may be necessary.  In the preoperative area the patient is seen extremity marked by both patient and surgeon antibiotic given a supraclavicular block was carried out without difficulty under the direction of the anesthesia department.  OPERATIVE COURSE: He was brought to the operating room prepped and draped in supine position with a right arm free.  ChloraPrep was used and a three-minute dry time allowed timeout taken confirming patient procedure.  The right limb was placed in the arc arthroscopy tower.  The 10  pounds of traction applied.  The joint was inflated to the 3-4 portal transverse incision made deepened with a hemostat blunt trocar used to enter the joint joint was inspected irrigated after placement of a 19-gauge needle in the 6U portal.  The volar radiocarpal ligaments were intact there was stretching of the scapholunate ligament complex but no tear.  The articular surface of the distal radius and proximal carpal row showed no significant degenerative changes.  A large 1 a tear of the TFCC was noted.  4-5 portal was then opened after localization with a 22-gauge needle can transverse incision was made deepened with a hemostat and a blunt trocar used to enter the joint.  The joint was inspected from the ulnar side.  The cartilage complex tear was noted the lunotriquetral joint did not show any significant changes.  The scope was reintroduced 3 for a shaver placed on 4 5 debridement of the TFCC was then performed.  This was facilitated with the use of a ophthalmological Beaver blade angled 60 degrees.  This allowed removal of the central aspect the triangular fibrocartilage complex.  The rim was stable.  The portion of the TFCC incised was removed with a grasper.  A localization for the ulnar midcarpal portal was made.  This was done with a 22-gauge needle.  Transverse incision made deepened with a hemostat blunt trocar used to enter the joint the joint was inspected significant changes were present on the proximal hamate with a type II lunate.  The remainder of the articular surface remained intact there was no gross instability to the scapholunate ligament complex  there was early degenerative changes of the STT joint.  A radial midcarpal portal was then opened after localization with a 22-gauge needle again a transverse incision made deepened the hemostat a blunt trocar used to enter the joint the scope was introduced in the radial midcarpal portal the shaver and the ulnar midcarpal portal and a debridement of  the proximal hamate was performed including partial bowel resection.  The instruments were removed.  The portals closed with interrupted 4-0 nylon sutures.  Sterile compressive dressing volar splint was applied.  Tourniquet was not used during the procedure.  The patient was taken to the recovery room for observation in satisfactory condition.  He will be discharged home to return the hand center [facility] in 1 week Tylenol ibuprofen for pain with Norco as a backup.  Daryll Brod, MD Electronically signed, 01/12/19

## 2019-01-12 NOTE — Anesthesia Preprocedure Evaluation (Addendum)
Anesthesia Evaluation  Patient identified by MRN, date of birth, ID band Patient awake    Reviewed: Allergy & Precautions, NPO status , Patient's Chart, lab work & pertinent test results, reviewed documented beta blocker date and time   Airway Mallampati: II  TM Distance: >3 FB Neck ROM: Full    Dental  (+) Teeth Intact   Pulmonary neg pulmonary ROS,    Pulmonary exam normal breath sounds clear to auscultation       Cardiovascular hypertension, Pt. on medications and Pt. on home beta blockers Normal cardiovascular exam Rhythm:Regular Rate:Normal     Neuro/Psych PSYCHIATRIC DISORDERS Anxiety Depression Panic attacksnegative neurological ROS     GI/Hepatic negative GI ROS, Neg liver ROS,   Endo/Other  Hyperlipidemia Obesity  Renal/GU negative Renal ROS  negative genitourinary   Musculoskeletal  (+) Arthritis , Triangular Fibrocartilage tear right wrist Gout   Abdominal (+) + obese,   Peds  Hematology negative hematology ROS (+)   Anesthesia Other Findings   Reproductive/Obstetrics                             Anesthesia Physical Anesthesia Plan  ASA: II  Anesthesia Plan: Regional and MAC   Post-op Pain Management:    Induction: Intravenous  PONV Risk Score and Plan: 2 and Ondansetron, Midazolam, Treatment may vary due to age or medical condition and Propofol infusion  Airway Management Planned: Natural Airway, Nasal Cannula and Simple Face Mask  Additional Equipment:   Intra-op Plan:   Post-operative Plan:   Informed Consent: I have reviewed the patients History and Physical, chart, labs and discussed the procedure including the risks, benefits and alternatives for the proposed anesthesia with the patient or authorized representative who has indicated his/her understanding and acceptance.     Dental advisory given  Plan Discussed with: CRNA and Surgeon  Anesthesia Plan  Comments:        Anesthesia Quick Evaluation

## 2019-01-12 NOTE — Brief Op Note (Signed)
01/12/2019  1:34 PM  PATIENT:  Franco Nones  53 y.o. male  PRE-OPERATIVE DIAGNOSIS:  TRIANGULAR FIBROCARTILAGE COMPLEX TEAR RIGHT WRIST  POST-OPERATIVE DIAGNOSIS:  TRIANGULAR FIBROCARTILAGE COMPLEX TEAR RIGHT WRIST  PROCEDURE:  Procedure(s) with comments: WRIST ARTHROSCOPY WITH DEBRIDEMENT OF TRIANGULAR FIBROCARTILEGE COMPLEX  TEAR (Right) - AXILLARY  SURGEON:  Surgeon(s) and Role:    * Daryll Brod, MD - Primary    * Leanora Cover, MD - Assisting  PHYSICIAN ASSISTANT:   ASSISTANTS: K Nahun Kronberg,MD   ANESTHESIA:   regional and IV sedation  EBL:  15 mL   BLOOD ADMINISTERED:none  DRAINS: none   LOCAL MEDICATIONS USED:  NONE  SPECIMEN:  No Specimen  DISPOSITION OF SPECIMEN:  N/A  COUNTS:  YES  TOURNIQUET:  * Missing tourniquet times found for documented tourniquets in log: [id number] *  DICTATION: .Viviann Spare Dictation  PLAN OF CARE: Discharge to home after PACU  PATIENT DISPOSITION:  PACU - hemodynamically stable.

## 2019-01-12 NOTE — Anesthesia Postprocedure Evaluation (Addendum)
Anesthesia Post Note  Patient: Benjamin Ray  Procedure(s) Performed: WRIST ARTHROSCOPY WITH DEBRIDEMENT OF TRIANGULAR FIBROCARTILEGE COMPLEX  TEAR (Right Wrist)     Patient location during evaluation: PACU Anesthesia Type: MAC and Regional Level of consciousness: awake and alert Pain management: pain level controlled Vital Signs Assessment: post-procedure vital signs reviewed and stable Respiratory status: spontaneous breathing, nonlabored ventilation, respiratory function stable and patient connected to nasal cannula oxygen Cardiovascular status: blood pressure returned to baseline and stable Postop Assessment: no apparent nausea or vomiting Anesthetic complications: no    Last Vitals:  Vitals:   01/12/19 1415 01/12/19 1440  BP: 106/72 117/78  Pulse: 60 (!) 56  Resp: 14 16  Temp:  36.7 C  SpO2: 97% 98%    Last Pain:  Vitals:   01/12/19 1440  TempSrc:   PainSc: 0-No pain                 Montez Hageman

## 2019-01-12 NOTE — Anesthesia Procedure Notes (Signed)
Anesthesia Regional Block: Supraclavicular block   Pre-Anesthetic Checklist: ,, timeout performed, Correct Patient, Correct Site, Correct Laterality, Correct Procedure, Correct Position, site marked, Risks and benefits discussed,  Surgical consent,  Pre-op evaluation,  At surgeon's request and post-op pain management  Laterality: Right  Prep: chloraprep       Needles:  Injection technique: Single-shot  Needle Type: Echogenic Stimulator Needle     Needle Length: 9cm  Needle Gauge: 21   Needle insertion depth: 5 cm   Additional Needles:   Procedures:,,,, ultrasound used (permanent image in chart),,,,  Narrative:  Start time: 01/12/2019 11:05 AM End time: 01/12/2019 11:09 AM Injection made incrementally with aspirations every 5 mL.  Performed by: Personally  Anesthesiologist: Josephine Igo, MD       Right Supraclavicular Block

## 2019-01-12 NOTE — Progress Notes (Signed)
Assisted Dr. Foster with right, ultrasound guided, axillary block. Side rails up, monitors on throughout procedure. See vital signs in flow sheet. Tolerated Procedure well. 

## 2019-01-12 NOTE — Discharge Instructions (Addendum)

## 2019-01-13 ENCOUNTER — Encounter (HOSPITAL_BASED_OUTPATIENT_CLINIC_OR_DEPARTMENT_OTHER): Payer: Self-pay | Admitting: Orthopedic Surgery

## 2019-01-13 NOTE — Addendum Note (Signed)
Addendum  created 01/13/19 0859 by Weslie Pretlow, Ernesta Amble, CRNA   Charge Capture section accepted

## 2019-01-13 NOTE — Addendum Note (Signed)
Addendum  created 01/13/19 9295 by Montez Hageman, MD   Clinical Note Signed, SmartForm saved

## 2019-01-23 ENCOUNTER — Encounter (HOSPITAL_BASED_OUTPATIENT_CLINIC_OR_DEPARTMENT_OTHER): Payer: Self-pay | Admitting: Orthopedic Surgery

## 2019-01-23 NOTE — Addendum Note (Signed)
Addendum  created 01/23/19 8366 by Montez Hageman, MD   Intraprocedure Staff edited

## 2020-05-08 ENCOUNTER — Ambulatory Visit (INDEPENDENT_AMBULATORY_CARE_PROVIDER_SITE_OTHER): Payer: 59

## 2020-05-08 ENCOUNTER — Encounter: Payer: Self-pay | Admitting: Sports Medicine

## 2020-05-08 ENCOUNTER — Ambulatory Visit (INDEPENDENT_AMBULATORY_CARE_PROVIDER_SITE_OTHER): Payer: 59 | Admitting: Sports Medicine

## 2020-05-08 ENCOUNTER — Other Ambulatory Visit: Payer: Self-pay

## 2020-05-08 DIAGNOSIS — G5752 Tarsal tunnel syndrome, left lower limb: Secondary | ICD-10-CM

## 2020-05-08 DIAGNOSIS — M729 Fibroblastic disorder, unspecified: Secondary | ICD-10-CM

## 2020-05-08 DIAGNOSIS — M216X2 Other acquired deformities of left foot: Secondary | ICD-10-CM

## 2020-05-08 DIAGNOSIS — M792 Neuralgia and neuritis, unspecified: Secondary | ICD-10-CM

## 2020-05-08 DIAGNOSIS — M79672 Pain in left foot: Secondary | ICD-10-CM

## 2020-05-08 MED ORDER — PREDNISONE 10 MG (21) PO TBPK: ORAL_TABLET | ORAL | 0 refills | Status: DC

## 2020-05-08 NOTE — Progress Notes (Addendum)
Subjective: Benjamin Ray is a 54 y.o. male patient presents to office with complaint of moderate to severe pain in the left arch reports that when he presses in his arch there is a severe burning sensation feels like a hot poker is stabbing him in the arch that happens worse with excessive walking or standing or after a period of rest and he goes to stand down on his foot he feels a severe pain reports that this pain comes and goes states that he finally got new work shoes but still has pain but prior to getting new work shoes he had a pair shoes that was broken in the middle of the arch area and underneath the toes states that he also got a over-the-counter brace and reports that the brace strap made the pain worse and his arch and was also looking into getting a night brace to sleep in.  Patient denies any nausea vomiting fever chills or any other constitutional symptoms at this time.  Patient is assisted by wife this visit.  Admits to a history of gout last flareup several years ago affecting the right ankle.  There are no problems to display for this patient.   Current Outpatient Medications on File Prior to Visit  Medication Sig Dispense Refill  . meloxicam (MOBIC) 7.5 MG tablet TAKE 1 TABLET(7.5 MG) BY MOUTH DAILY WITH FOOD    . allopurinol (ZYLOPRIM) 100 MG tablet Take 200 mg by mouth daily.     Marland Kitchen amLODipine (NORVASC) 5 MG tablet Take 5 mg by mouth daily.    Marland Kitchen atenolol (TENORMIN) 50 MG tablet Take 50 mg by mouth daily.    Marland Kitchen dexamethasone (DECADRON) 4 MG tablet Take by mouth.    . doxycycline (VIBRAMYCIN) 100 MG capsule Take 100 mg by mouth 2 (two) times daily.    Marland Kitchen escitalopram (LEXAPRO) 10 MG tablet Take 10 mg by mouth daily.    Marland Kitchen guaiFENesin-codeine 100-10 MG/5ML syrup Take by mouth.    Marland Kitchen HYDROcodone-acetaminophen (NORCO) 5-325 MG tablet Take 1 tablet by mouth every 6 (six) hours as needed for moderate pain. 30 tablet 0  . Multiple Vitamin (MULTIVITAMIN WITH MINERALS) TABS tablet Take 1  tablet by mouth daily.    . pravastatin (PRAVACHOL) 20 MG tablet Take 20 mg by mouth daily.    . predniSONE (DELTASONE) 20 MG tablet Take 40 mg by mouth daily.    . valsartan (DIOVAN) 160 MG tablet Take 320 mg by mouth daily.      No current facility-administered medications on file prior to visit.    No Known Allergies  Objective: Physical Exam General: The patient is alert and oriented x3 in no acute distress.  Dermatology: Skin is warm, dry and supple bilateral lower extremities. Nails 1-10 are normal. There is no erythema, edema, no eccymosis, no open lesions present. Integument is otherwise unremarkable.  Vascular: Dorsalis Pedis pulse and Posterior Tibial pulse are 2/4 bilateral. Capillary fill time is immediate to all digits.  Neurological: Grossly intact to light touch bilateral.  Burning pain with palpation along the posterior tibial nerve course.  Negative Tinel's or Valleuix sign on left.  Musculoskeletal: No significant tenderness at the medial calcaneal tubercle at the insertion of the plantar fascia on the left however there is pain distally into the arch as noted above at the posterior tibial nerve course. Strength 5/5 in all groups bilateral with mild guarding due to pain and limitation on ankle dorsiflexion secondary to likely equinus.   Gait: Unassisted, Antalgic  avoid weight on left heel  Xray, Left foot:  Normal osseous mineralization. Joint spaces preserved. No fracture/dislocation/boney destruction. Calcaneal spur present with mild thickening of plantar fascia. No other soft tissue abnormalities or radiopaque foreign bodies.   Assessment and Plan: Problem List Items Addressed This Visit    None    Visit Diagnoses    Arch pain of left foot    -  Primary   Relevant Orders   DG Foot Complete Left (Completed)   Tarsal tunnel syndrome of left side       Neuritis       Fasciitis       Left foot pain       Acquired equinus deformity of left foot            -Complete examination performed.  -Xrays reviewed -Discussed with patient in detail the condition of arch pain likely tarsal tunnel versus distal plantar fasciitis with equinus -Rx prednisone Dosepak to take as instructed - Explained in detail the use of the left night splint which was dispensed at today's visit. -Explained and dispensed to patient daily stretching exercises. -Recommend patient to ice affected area 1-2x daily. -Continue with good supportive shoes daily for foot type and to refrain from walking barefoot -Patient to return to office in 4 weeks for follow up or sooner if problems or questions arise.  Asencion Islam, DPM

## 2020-05-08 NOTE — Patient Instructions (Signed)
Plantar Fasciitis Rehab Ask your health care provider which exercises are safe for you. Do exercises exactly as told by your health care provider and adjust them as directed. It is normal to feel mild stretching, pulling, tightness, or discomfort as you do these exercises. Stop right away if you feel sudden pain or your pain gets worse. Do not begin these exercises until told by your health care provider. Stretching and range-of-motion exercises These exercises warm up your muscles and joints and improve the movement and flexibility of your foot. These exercises also help to relieve pain. Plantar fascia stretch  1. Sit with your left / right leg crossed over your opposite knee. 2. Hold your heel with one hand with that thumb near your arch. With your other hand, hold your toes and gently pull them back toward the top of your foot. You should feel a stretch on the bottom of your toes or your foot (plantar fascia) or both. 3. Hold this stretch for__________ seconds. 4. Slowly release your toes and return to the starting position. Repeat __________ times. Complete this exercise __________ times a day. Gastrocnemius stretch, standing This exercise is also called a calf (gastroc) stretch. It stretches the muscles in the back of the upper calf. 1. Stand with your hands against a wall. 2. Extend your left / right leg behind you, and bend your front knee slightly. 3. Keeping your heels on the floor and your back knee straight, shift your weight toward the wall. Do not arch your back. You should feel a gentle stretch in your upper left / right calf. 4. Hold this position for __________ seconds. Repeat __________ times. Complete this exercise __________ times a day. Soleus stretch, standing This exercise is also called a calf (soleus) stretch. It stretches the muscles in the back of the lower calf. 1. Stand with your hands against a wall. 2. Extend your left / right leg behind you, and bend your front  knee slightly. 3. Keeping your heels on the floor, bend your back knee and shift your weight slightly over your back leg. You should feel a gentle stretch deep in your lower calf. 4. Hold this position for __________ seconds. Repeat __________ times. Complete this exercise __________ times a day. Gastroc and soleus stretch, standing step This exercise stretches the muscles in the back of the lower leg. These muscles are in the upper calf (gastrocnemius) and the lower calf (soleus). 1. Stand with the ball of your left / right foot on a step. The ball of your foot is on the walking surface, right under your toes. 2. Keep your other foot firmly on the same step. 3. Hold on to the wall or a railing for balance. 4. Slowly lift your other foot, allowing your body weight to press your left / right heel down over the edge of the step. You should feel a stretch in your left / right calf. 5. Hold this position for __________ seconds. 6. Return both feet to the step. 7. Repeat this exercise with a slight bend in your left / right knee. Repeat __________ times with your left / right knee straight and __________ times with your left / right knee bent. Complete this exercise __________ times a day. Balance exercise This exercise builds your balance and strength control of your arch to help take pressure off your plantar fascia. Single leg stand If this exercise is too easy, you can try it with your eyes closed or while standing on a pillow. 1.   Without shoes, stand near a railing or in a doorway. You may hold on to the railing or door frame as needed. 2. Stand on your left / right foot. Keep your big toe down on the floor and try to keep your arch lifted. Do not let your foot roll inward. 3. Hold this position for __________ seconds. Repeat __________ times. Complete this exercise __________ times a day. This information is not intended to replace advice given to you by your health care provider. Make sure  you discuss any questions you have with your health care provider. Document Revised: 09/29/2018 Document Reviewed: 04/06/2018 Elsevier Patient Education  2020 [employer]   Tarsal Tunnel Syndrome  Tarsal tunnel syndrome is a condition that happens when a nerve called the tibial nerve is irritated or squeezed (compressed) as it passes through an area on the inside of your ankle (tarsal tunnel). The tarsal tunnel is a narrow passage through the connective tissue and bones in your feet (tarsals). The tibial nerve passes behind the large bony bump at your inner ankle (medial malleolus) and sends branches to your foot and toes. This nerve enables feeling by passing signals to your heel, the bottom of your foot, and some of your toe muscles. Tarsal tunnel syndrome usually causes ankle and foot pain that gets worse with activity. What are the causes? Tarsal tunnel syndrome can be caused by any condition that narrows the space in the tarsal tunnel. Athletes may get tarsal tunnel syndrome from a fractured ankle or from an outward (eversion) ankle sprain that results in scarring or swelling. Other common causes include:  Overpronation. This is when your feet roll inward and flatten too much when you stand, walk, or run.  Extra pressure on the tarsal tunnel area from tight or stiff shoes or boots.  Decreased room in the tarsal tunnel due to small, fluid-filled sacs (cysts) or growths on the bones near the tunnel (exostosis). What increases the risk? This condition is more likely to develop in people who:  Play sports where they wear high, stiff boots, such as downhill skiing.  Play sports with repetitive motion, such as running.  Play sports on uneven surfaces that can lead to a sprained ankle, such as soccer or football.  Have had an inner ankle injury.  Have flat feet.  Have other conditions, such as diabetes, hypothyroidism, or rheumatoid arthritis. What are the signs or symptoms? Symptoms  of this condition include:  Burning pain behind the ankle, in the heel, or in the foot. This pain gets worse if you are standing, walking, or running.  Numbness or a prickling and tingling sensation in your heel, foot, or toes.  Swelling in your ankle or heel. At first, your symptoms may get worse with activity and be relieved with rest. Over time, your symptoms may become constant or come on sooner with less activity. How is this diagnosed? This condition is diagnosed based on:  Your symptoms.  Your medical history.  A physical exam. Your health care provider may tap on the area below your ankle to check for tingling in your foot or toes.  You may also have other tests, including: ? X-rays to check bone structure. ? An MRI or ultrasound to examine nerve and tendon structures and find where your nerve is getting compressed. ? A study of nerve function (electromyography or EMG). How is this treated? Treatment may include:  Wearing a removable splint or boot for ankle support.  Using a shoe insert (orthotic) to  help support your arch.  Taking NSAIDs to reduce pain.  Using ice to reduce swelling.  Having medicine injected into your ankle joint to reduce pain and swelling.  Starting range-of-motion exercises and strengthening exercises.  Gradually returning to full activity. The timing will depend on the severity of your condition and your response to treatment.  Surgery. Follow these instructions at home: If you have a splint or boot:  Wear the splint or boot as told by your health care provider. Remove it only as told by your health care provider.  Loosen the splint or boot if your toes tingle, become numb, or turn cold and blue.  Keep the splint or boot clean.  If your splint or boot is not waterproof: ? Do not let it get wet. ? Cover it with a watertight covering when you take a bath or shower.  Ask your health care provider when it is safe to drive if your splint  or boot is on a foot that you use for driving. Managing pain, stiffness, and swelling   If directed, put ice on the injured area. ? If you have a removable splint or boot, remove it as told by your health care provider. ? Put ice in a plastic bag. ? Place a towel between your skin and the bag. ? Leave the ice on for 20 minutes, 2-3 times a day.  Move your toes often to reduce stiffness and swelling.  Raise (elevate) your foot above the level of your heart while you are sitting or lying down. Activity  Return to your normal activities as told by your health care provider. Ask your health care provider what activities are safe for you.  Do exercises as told by your health care provider. General instructions  Take over-the-counter and prescription medicines only as told by your health care provider.  Do not use any products that contain nicotine or tobacco, such as cigarettes, e-cigarettes, and chewing tobacco. These can delay healing. If you need help quitting, ask your health care provider.  Keep all follow-up visits as told by your health care provider. This is important. How is this prevented?  Give your body time to rest between periods of activity.  Make sure to wear supportive and comfortable shoes during athletic activity.  Do not overtighten ski boots or the laces on high-top shoes.  Be safe and responsible while being active to avoid falls. Contact a health care provider if:  Your ankle pain is not getting better.  You are unable to support (bear) body weight on your foot without pain. Summary  Tarsal tunnel syndrome is a condition that happens when a nerve called the tibial nerve is irritated or squeezed (compressed) as it passes through an area on the inside of your ankle (tarsal tunnel).  Tarsal tunnel syndrome usually causes ankle and foot pain that gets worse with activity.  This condition may be treated with a splint or boot, shoe inserts, ice, exercises,  medicines, and surgery if needed.  Follow your health care provider's instructions for caring for your splint or boot.  Contact your health care provider if your ankle pain is not getting better, or if you are unable to bear your body weight without pain. This information is not intended to replace advice given to you by your health care provider. Make sure you discuss any questions you have with your health care provider. Document Revised: 04/28/2018 Document Reviewed: 04/28/2018 Elsevier Patient Education  2020 [employer].

## 2020-06-12 ENCOUNTER — Ambulatory Visit (INDEPENDENT_AMBULATORY_CARE_PROVIDER_SITE_OTHER): Payer: 59 | Admitting: Sports Medicine

## 2020-06-12 ENCOUNTER — Other Ambulatory Visit: Payer: Self-pay

## 2020-06-12 ENCOUNTER — Encounter: Payer: Self-pay | Admitting: Sports Medicine

## 2020-06-12 DIAGNOSIS — M729 Fibroblastic disorder, unspecified: Secondary | ICD-10-CM

## 2020-06-12 DIAGNOSIS — G5752 Tarsal tunnel syndrome, left lower limb: Secondary | ICD-10-CM | POA: Diagnosis not present

## 2020-06-12 DIAGNOSIS — M79672 Pain in left foot: Secondary | ICD-10-CM | POA: Diagnosis not present

## 2020-06-12 DIAGNOSIS — M792 Neuralgia and neuritis, unspecified: Secondary | ICD-10-CM

## 2020-06-12 NOTE — Progress Notes (Addendum)
Subjective: Kort Stettler is a 54 y.o. male patient returns office for follow-up evaluation of left foot pain.  Patient reports that pain is doing much better only has occasional sharp shooting pains but nothing like before feels like he is doing well and that the night splint helps.  Patient states that he feels like he is cured however does have a small amount of tenderness that comes and goes to the arch area of the foot but otherwise is much better.  Patient denies any other pedal complaints at this time.  Patient Active Problem List   Diagnosis Date Noted  . Right wrist pain 09/23/2018  . Acquired trigger finger 11/22/2017  . Pain 11/22/2017    Current Outpatient Medications on File Prior to Visit  Medication Sig Dispense Refill  . celecoxib (CELEBREX) 200 MG capsule Take by mouth.    Marland Kitchen allopurinol (ZYLOPRIM) 100 MG tablet Take 200 mg by mouth daily.     Marland Kitchen amLODipine (NORVASC) 5 MG tablet Take 5 mg by mouth daily.    Marland Kitchen atenolol (TENORMIN) 50 MG tablet Take 50 mg by mouth daily.    Marland Kitchen dexamethasone (DECADRON) 4 MG tablet Take by mouth.    . doxycycline (VIBRAMYCIN) 100 MG capsule Take 100 mg by mouth 2 (two) times daily.    Marland Kitchen escitalopram (LEXAPRO) 10 MG tablet Take 10 mg by mouth daily.    Marland Kitchen guaiFENesin-codeine 100-10 MG/5ML syrup Take by mouth.    Marland Kitchen HYDROcodone-acetaminophen (NORCO) 5-325 MG tablet Take 1 tablet by mouth every 6 (six) hours as needed for moderate pain. 30 tablet 0  . meloxicam (MOBIC) 7.5 MG tablet TAKE 1 TABLET(7.5 MG) BY MOUTH DAILY WITH FOOD    . Multiple Vitamin (MULTIVITAMIN WITH MINERALS) TABS tablet Take 1 tablet by mouth daily.    . pravastatin (PRAVACHOL) 20 MG tablet Take 20 mg by mouth daily.    . predniSONE (STERAPRED UNI-PAK 21 TAB) 10 MG (21) TBPK tablet TAKE AS DIRECTED 21 tablet 0  . valsartan (DIOVAN) 160 MG tablet Take 320 mg by mouth daily.      No current facility-administered medications on file prior to visit.    No Known  Allergies  Objective: Physical Exam General: The patient is alert and oriented x3 in no acute distress.  Dermatology: Skin is warm, dry and supple bilateral lower extremities. Nails 1-10 are normal. There is no erythema, edema, no eccymosis, no open lesions present. Integument is otherwise unremarkable.  Vascular: Dorsalis Pedis pulse and Posterior Tibial pulse are 2/4 bilateral. Capillary fill time is immediate to all digits.  Neurological: Grossly intact to light touch bilateral.  Burning pain with palpation along the posterior tibial nerve course.  Negative Tinel's or Valleuix sign on left.  Musculoskeletal: No significant tenderness at the medial calcaneal tubercle at the insertion of the plantar fascia on the left however there is pain distally into the arch as noted above at the posterior tibial nerve course that is much improved as compared to prior. Strength 5/5 in all groups bilateral with mild guarding due to pain and limitation on ankle dorsiflexion secondary to likely equinus.   Assessment and Plan: Problem List Items Addressed This Visit   None   Visit Diagnoses    Arch pain of left foot    -  Primary   Tarsal tunnel syndrome of left side       Neuritis       Fasciitis       Left foot pain           -  Complete examination performed.  -Rediscussed arch pain distal letter fasciitis and resolving neuritis -Dispensed a plantar fascial brace for patient to use on the left foot and advised patient if this works well may benefit long-term for custom functional foot orthotics.  I provided patient with the orthotic codes for him to call to check with his insurance regarding coverage -Continue with night splint -Continue with daily stretching and icing as previously instructed -Continue with good supportive shoes daily for foot type -Continue Celebrex as given by orthopedic doctor -Patient to return to office in 4 to 5 weeks for follow up or sooner if problems or questions  arise.  Asencion Islam, DPM

## 2020-06-12 NOTE — Patient Instructions (Signed)
Orthotic code: L3020 Diagnosis code: M72.2 plantar fasciitis M79.2

## 2020-07-19 ENCOUNTER — Encounter: Payer: Self-pay | Admitting: Sports Medicine

## 2020-07-19 ENCOUNTER — Ambulatory Visit (INDEPENDENT_AMBULATORY_CARE_PROVIDER_SITE_OTHER): Payer: 59 | Admitting: Sports Medicine

## 2020-07-19 ENCOUNTER — Other Ambulatory Visit: Payer: Self-pay

## 2020-07-19 DIAGNOSIS — M792 Neuralgia and neuritis, unspecified: Secondary | ICD-10-CM

## 2020-07-19 DIAGNOSIS — M216X2 Other acquired deformities of left foot: Secondary | ICD-10-CM | POA: Diagnosis not present

## 2020-07-19 DIAGNOSIS — M79672 Pain in left foot: Secondary | ICD-10-CM | POA: Diagnosis not present

## 2020-07-19 DIAGNOSIS — M729 Fibroblastic disorder, unspecified: Secondary | ICD-10-CM | POA: Diagnosis not present

## 2020-07-19 DIAGNOSIS — G5752 Tarsal tunnel syndrome, left lower limb: Secondary | ICD-10-CM

## 2020-07-19 MED ORDER — TRIAMCINOLONE ACETONIDE 10 MG/ML IJ SUSP
10.0000 mg | Freq: Once | INTRAMUSCULAR | Status: AC
  Administered 2020-07-19: 10 mg

## 2020-07-19 NOTE — Progress Notes (Signed)
Subjective: Benjamin Ray is a 55 y.o. male patient returns office for follow-up evaluation of left foot pain.  Patient reports that he still has pain but the night splint and the plantar fascial brace seems to help however if the brace touches the sore spot in his arch it is very painful.  Patient also reports that he talked with his insurance company about orthotics and desires to get them.  Patient denies any increase in swelling warmth redness but does admit to some burning pain in the medial heel and arch area.  Patient is assisted by wife's visit. Patient Active Problem List   Diagnosis Date Noted  . Right wrist pain 09/23/2018  . Acquired trigger finger 11/22/2017  . Pain 11/22/2017    Current Outpatient Medications on File Prior to Visit  Medication Sig Dispense Refill  . allopurinol (ZYLOPRIM) 100 MG tablet Take 200 mg by mouth daily.     Marland Kitchen amLODipine (NORVASC) 5 MG tablet Take 5 mg by mouth daily.    Marland Kitchen atenolol (TENORMIN) 50 MG tablet Take 50 mg by mouth daily.    . celecoxib (CELEBREX) 200 MG capsule Take by mouth.    . dexamethasone (DECADRON) 4 MG tablet Take by mouth.    . doxycycline (VIBRAMYCIN) 100 MG capsule Take 100 mg by mouth 2 (two) times daily.    Marland Kitchen escitalopram (LEXAPRO) 10 MG tablet Take 10 mg by mouth daily.    Marland Kitchen guaiFENesin-codeine 100-10 MG/5ML syrup Take by mouth.    Marland Kitchen HYDROcodone-acetaminophen (NORCO) 5-325 MG tablet Take 1 tablet by mouth every 6 (six) hours as needed for moderate pain. 30 tablet 0  . meloxicam (MOBIC) 7.5 MG tablet TAKE 1 TABLET(7.5 MG) BY MOUTH DAILY WITH FOOD    . Multiple Vitamin (MULTIVITAMIN WITH MINERALS) TABS tablet Take 1 tablet by mouth daily.    . pravastatin (PRAVACHOL) 20 MG tablet Take 20 mg by mouth daily.    . predniSONE (STERAPRED UNI-PAK 21 TAB) 10 MG (21) TBPK tablet TAKE AS DIRECTED 21 tablet 0  . valsartan (DIOVAN) 160 MG tablet Take 320 mg by mouth daily.      No current facility-administered medications on file prior to  visit.    No Known Allergies  Objective: Physical Exam General: The patient is alert and oriented x3 in no acute distress.  Dermatology: Skin is warm, dry and supple bilateral lower extremities. Nails 1-10 are normal. There is no erythema, edema, no eccymosis, no open lesions present. Integument is otherwise unremarkable.  Vascular: Dorsalis Pedis pulse and Posterior Tibial pulse are 2/4 bilateral. Capillary fill time is immediate to all digits.  Neurological: Grossly intact to light touch bilateral.  Burning pain with palpation along the posterior tibial nerve course.  Negative Tinel's or Valleuix sign on left.  Musculoskeletal: There is tenderness at the medial calcaneal tubercle at the insertion of the plantar fascia on the left and less into the distal arch on the left. Strength 5/5 in all groups bilateral with mild guarding due to pain and limitation on ankle dorsiflexion secondary to likely equinus.   Assessment and Plan: Problem List Items Addressed This Visit   None   Visit Diagnoses    Arch pain of left foot    -  Primary   Tarsal tunnel syndrome of left side       Neuritis       Fasciitis       Relevant Medications   triamcinolone acetonide (KENALOG) 10 MG/ML injection 10 mg (Start on 07/19/2020  5:00 PM)   Left foot pain       Relevant Medications   triamcinolone acetonide (KENALOG) 10 MG/ML injection 10 mg (Start on 07/19/2020  5:00 PM)   Acquired equinus deformity of left foot           -Complete examination performed.  -Rediscussed arch pain distal plantar fasciitis with neuritis -After oral consent and aseptic prep, injected a mixture containing 1 ml of 2%  plain lidocaine, 1 ml 0.5% plain marcaine, 0.5 ml of kenalog 10 and 0.5 ml of dexamethasone phosphate into left medial heel at glabrous junction without complication. Post-injection care discussed with patient.  -Patient was casted today for custom functional foot orthotics foam molds were sent to [facility] -Continue with plantar fascial brace meanwhile until he can get his custom orthotic s-Continue with night splint -Continue with daily stretching and icing as previously instructed -Continue with good supportive shoes daily for foot type -Continue Celebrex as given by orthopedic doctor like previous for pain and inflammation -Patient to return to office in 4 to 5 weeks for follow up and pickup of orthotics or sooner if problems or questions arise.  Asencion Islam, DPM

## 2020-08-16 ENCOUNTER — Other Ambulatory Visit: Payer: Self-pay

## 2020-08-16 ENCOUNTER — Ambulatory Visit (INDEPENDENT_AMBULATORY_CARE_PROVIDER_SITE_OTHER): Payer: 59 | Admitting: Sports Medicine

## 2020-08-16 ENCOUNTER — Ambulatory Visit: Payer: 59 | Admitting: Sports Medicine

## 2020-08-16 ENCOUNTER — Encounter: Payer: Self-pay | Admitting: Sports Medicine

## 2020-08-16 DIAGNOSIS — M792 Neuralgia and neuritis, unspecified: Secondary | ICD-10-CM

## 2020-08-16 DIAGNOSIS — M79672 Pain in left foot: Secondary | ICD-10-CM | POA: Diagnosis not present

## 2020-08-16 DIAGNOSIS — G5752 Tarsal tunnel syndrome, left lower limb: Secondary | ICD-10-CM | POA: Diagnosis not present

## 2020-08-16 DIAGNOSIS — M216X2 Other acquired deformities of left foot: Secondary | ICD-10-CM

## 2020-08-16 DIAGNOSIS — M729 Fibroblastic disorder, unspecified: Secondary | ICD-10-CM

## 2020-08-16 NOTE — Progress Notes (Signed)
Subjective: Benjamin Ray is a 55 y.o. male patient returns office for follow-up evaluation of left foot pain.  Patient reports that after the injection he has not had any pain.  Patient is also here for dispensing of orthotics.  No other pedal complaints noted.  Patient Active Problem List   Diagnosis Date Noted  . Right wrist pain 09/23/2018  . Acquired trigger finger 11/22/2017  . Pain 11/22/2017    Current Outpatient Medications on File Prior to Visit  Medication Sig Dispense Refill  . allopurinol (ZYLOPRIM) 100 MG tablet Take 200 mg by mouth daily.     Marland Kitchen amLODipine (NORVASC) 5 MG tablet Take 5 mg by mouth daily.    Marland Kitchen atenolol (TENORMIN) 50 MG tablet Take 50 mg by mouth daily.    . celecoxib (CELEBREX) 200 MG capsule Take by mouth.    . dexamethasone (DECADRON) 4 MG tablet Take by mouth.    . doxycycline (VIBRAMYCIN) 100 MG capsule Take 100 mg by mouth 2 (two) times daily.    Marland Kitchen escitalopram (LEXAPRO) 10 MG tablet Take 10 mg by mouth daily.    Marland Kitchen guaiFENesin-codeine 100-10 MG/5ML syrup Take by mouth.    Marland Kitchen HYDROcodone-acetaminophen (NORCO) 5-325 MG tablet Take 1 tablet by mouth every 6 (six) hours as needed for moderate pain. 30 tablet 0  . meloxicam (MOBIC) 7.5 MG tablet TAKE 1 TABLET(7.5 MG) BY MOUTH DAILY WITH FOOD    . Multiple Vitamin (MULTIVITAMIN WITH MINERALS) TABS tablet Take 1 tablet by mouth daily.    . pravastatin (PRAVACHOL) 20 MG tablet Take 20 mg by mouth daily.    . predniSONE (STERAPRED UNI-PAK 21 TAB) 10 MG (21) TBPK tablet TAKE AS DIRECTED 21 tablet 0  . valsartan (DIOVAN) 160 MG tablet Take 320 mg by mouth daily.      No current facility-administered medications on file prior to visit.    No Known Allergies  Objective: Physical Exam General: The patient is alert and oriented x3 in no acute distress.  Dermatology: Skin is warm, dry and supple bilateral lower extremities. Nails 1-10 are normal. There is no erythema, edema, no eccymosis, no open lesions present.  Integument is otherwise unremarkable.  Vascular: Dorsalis Pedis pulse and Posterior Tibial pulse are 2/4 bilateral. Capillary fill time is immediate to all digits.  Neurological: Grossly intact to light touch bilateral.  Burning pain with palpation along the posterior tibial nerve course.  Negative Tinel's or Valleuix sign on left.  Musculoskeletal: There is no reproducible tenderness at the medial calcaneal tubercle at the insertion of the plantar fascia on the left or the distal arch on the left. Strength 5/5 in all groups bilateral.  Orthotics were placed in shoes patient was able to ambulate 20 feet without pain or discomfort.  Patient reports that he does feel the support of his new orthotics and will take them home and try them in his work shoes as well.  Assessment and Plan: Problem List Items Addressed This Visit   None   Visit Diagnoses    Arch pain of left foot    -  Primary   Fasciitis       Tarsal tunnel syndrome of left side       Neuritis       Left foot pain       Acquired equinus deformity of left foot           -Complete examination performed.  -Orthotics were dispensed with wear and break in.  Explained -Advised patient  to prevent reoccurrence to continue with daily stretching or using night splint -Advised patient to return to office if symptoms recur or worsen -Patient to return to office as needed or sooner if problems or questions arise.  Asencion Islam, DPM

## 2020-08-16 NOTE — Patient Instructions (Signed)

## 2020-09-05 ENCOUNTER — Other Ambulatory Visit: Payer: Self-pay | Admitting: Orthopedic Surgery

## 2020-09-05 DIAGNOSIS — M24132 Other articular cartilage disorders, left wrist: Secondary | ICD-10-CM

## 2020-10-04 ENCOUNTER — Ambulatory Visit
Admission: RE | Admit: 2020-10-04 | Discharge: 2020-10-04 | Disposition: A | Discharge status: Home / Self Care | Payer: 59 | Source: Ambulatory Visit | Attending: Orthopedic Surgery | Admitting: Orthopedic Surgery

## 2020-10-04 ENCOUNTER — Other Ambulatory Visit: Payer: Self-pay

## 2020-10-04 DIAGNOSIS — M24132 Other articular cartilage disorders, left wrist: Secondary | ICD-10-CM

## 2020-10-04 IMAGING — MR MR WRIST*L* W/CM
6 series · 37 of 40 positions shown · non-contrast
Comparison: None

CLINICAL DATA: TFCC injury.  Pain in the left wrist for 6 months.

EXAM:
MR OF THE LEFT WRIST WITH INTRA-ARTICULAR CONTRAST
TECHNIQUE: Multiplanar, multisequence MR imaging of the left wrist was
performed following the administration of intra-articular contrast.

[Series 4: T1 fat-sat · axial · 3.0mm · 0.20mm/px · z∈[-37,+43]mm · 7 of 26 slices shown (1 of 3)]
[im 1/26]
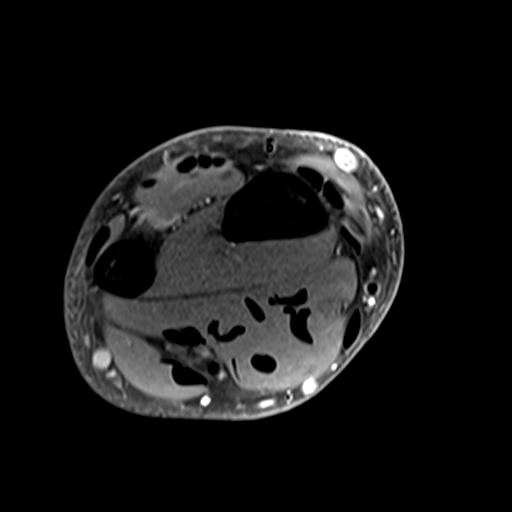
[im 5/26]
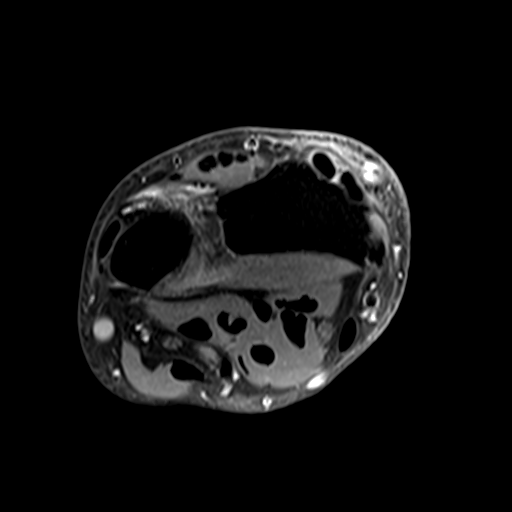
[im 9/26]
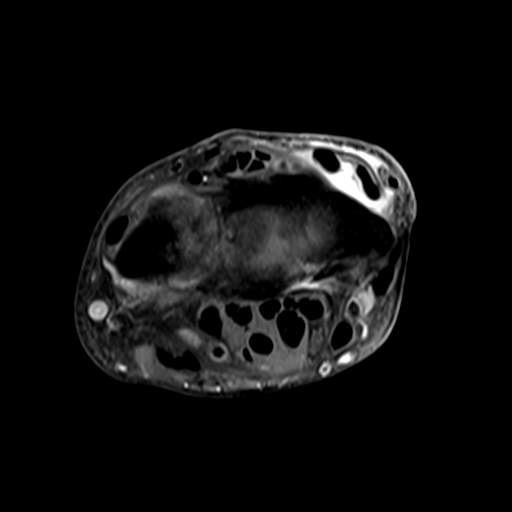
[im 13/26]
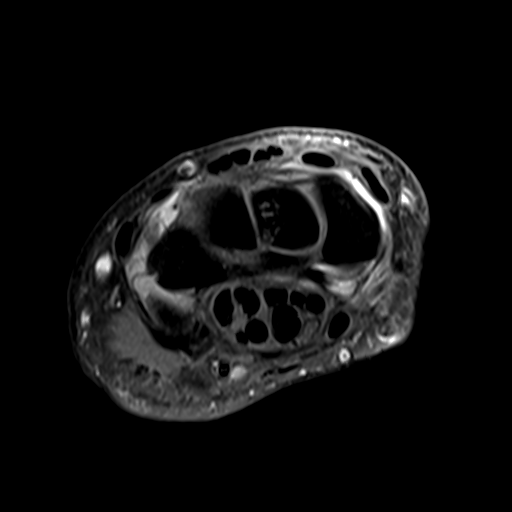
[im 17/26]
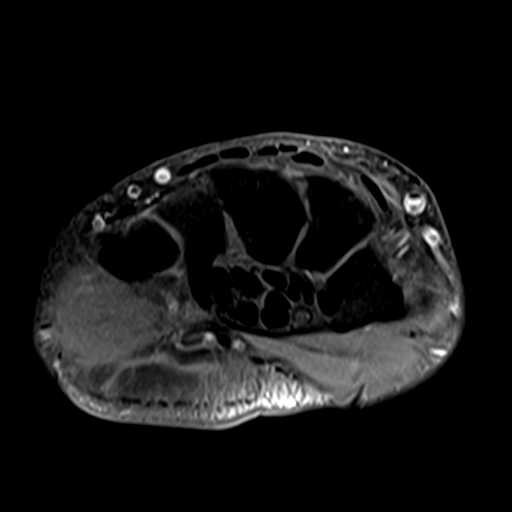
[im 21/26]
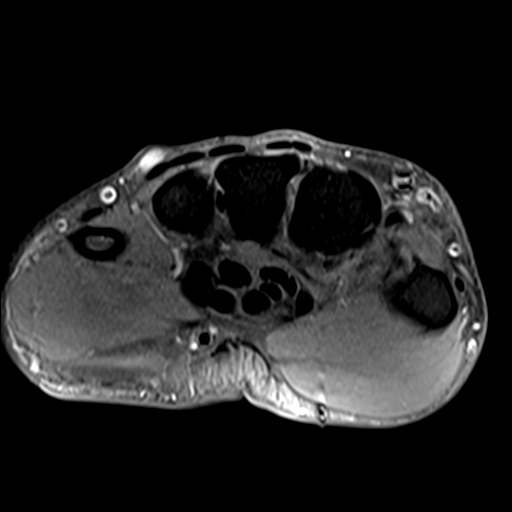
[im 26/26]
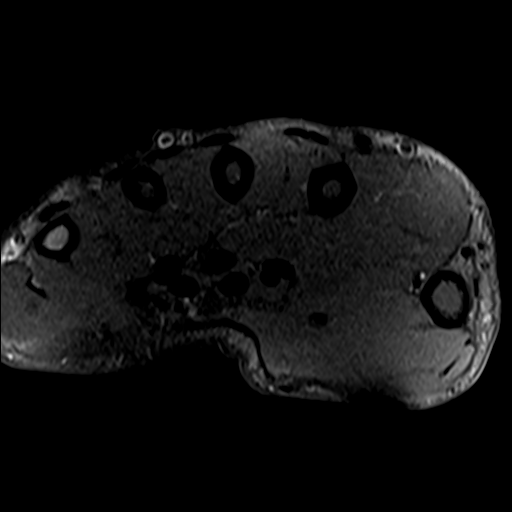

[Series 5: T2 fat-sat · axial · 3.0mm · 0.39mm/px · z∈[-37,+43]mm · 7 of 26 slices shown (1 of 3)]
[im 1/26]
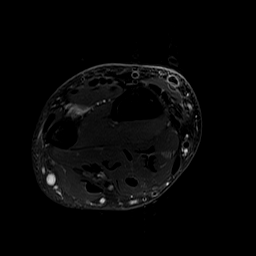
[im 5/26]
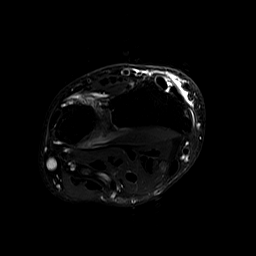
[im 9/26]
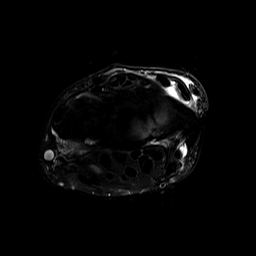
[im 13/26]
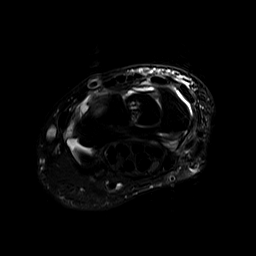
[im 17/26]
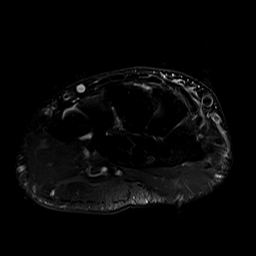
[im 21/26]
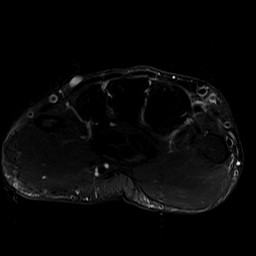
[im 26/26]
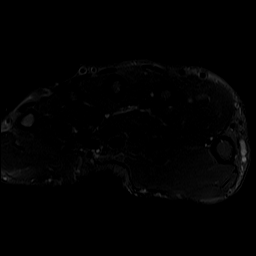

[Series 6: T1 fat-sat · coronal · 3.0mm · 0.34mm/px · 6 of 20 slices shown (2 of 3)]
[im 1/20]
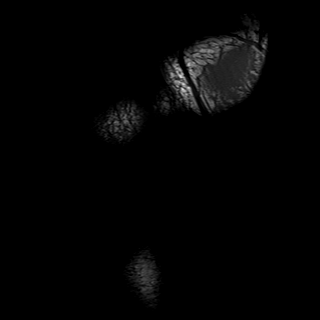
[im 4/20]
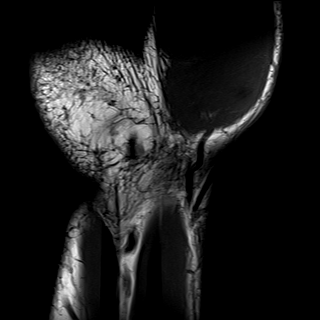
[im 8/20]
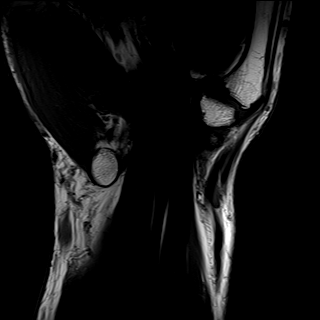
[im 12/20]
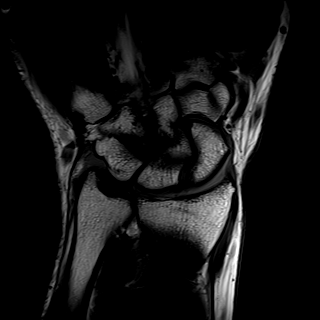
[im 16/20]
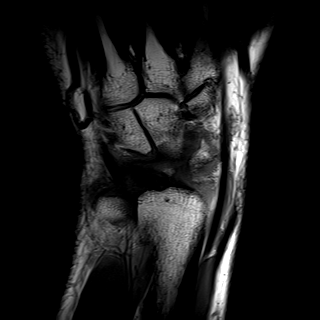
[im 20/20]
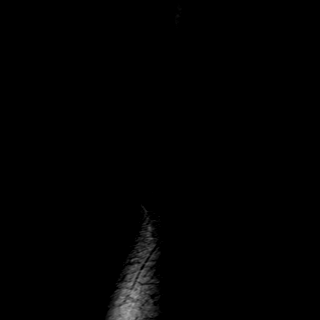

[Series 7: T1 fat-sat · coronal · 3.0mm · 0.21mm/px · 3 of 20 slices shown (3 of 3)]
[im 1/20]
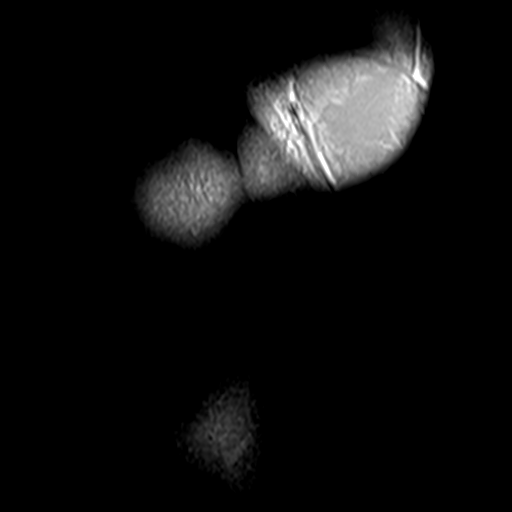
[im 4/20]
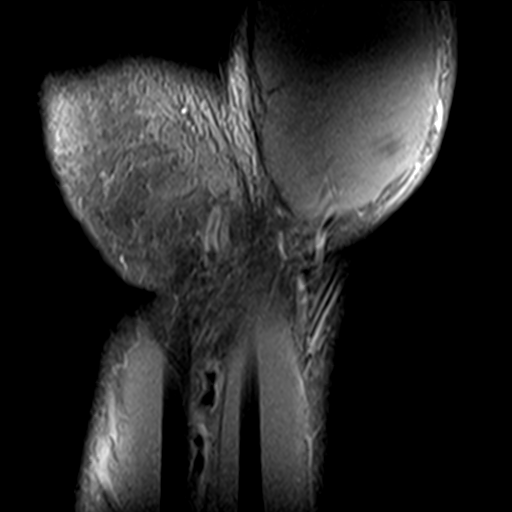
[im 8/20]
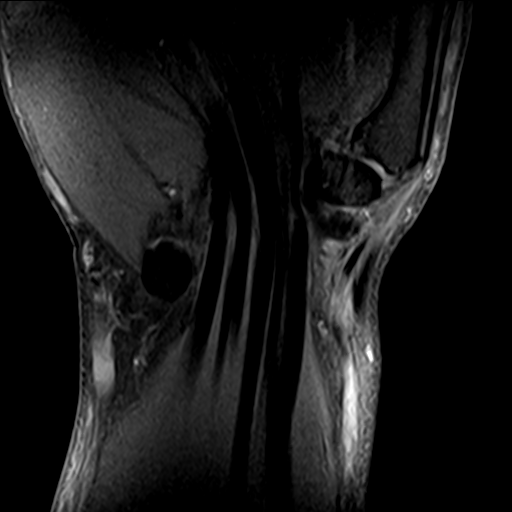

[Series 8: T2 fat-sat · coronal · 3.0mm · 0.43mm/px · 6 of 20 slices shown (2 of 3)]
[im 1/20]
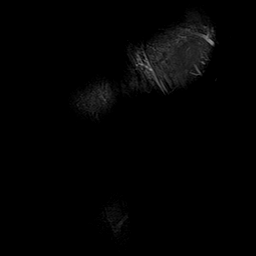
[im 4/20]
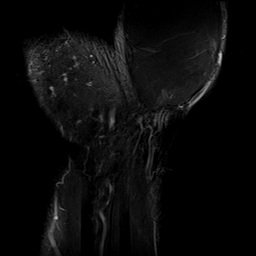
[im 8/20]
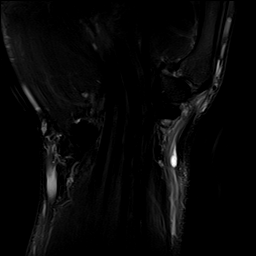
[im 12/20]
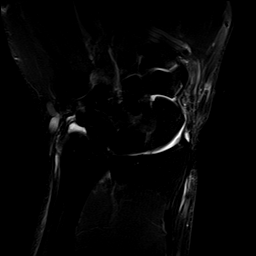
[im 16/20]
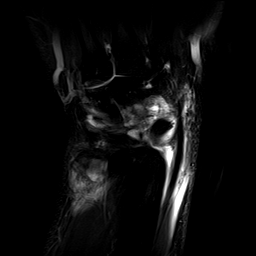
[im 20/20]
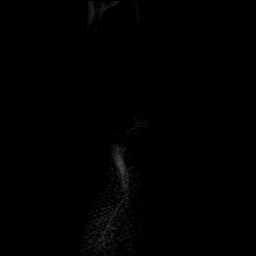

[Series 9: T2 fat-sat · sagittal · 3.0mm · 0.43mm/px · 8 of 27 slices shown (3 of 3)]
[im 1/27]
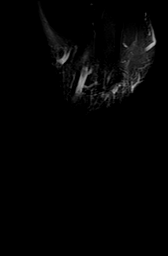
[im 4/27]
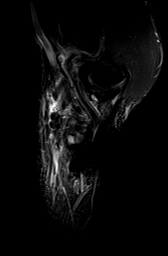
[im 8/27]
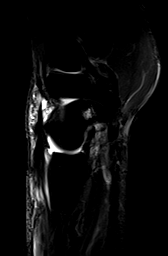
[im 12/27]
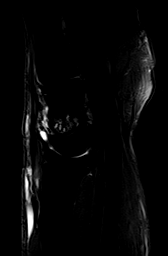
[im 15/27]
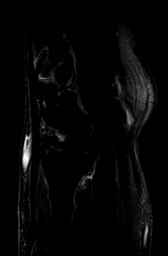
[im 19/27]
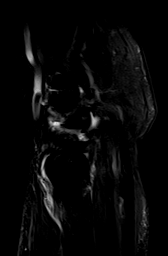
[im 23/27]
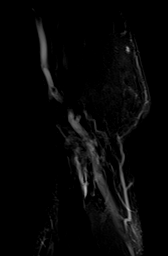
[im 27/27]
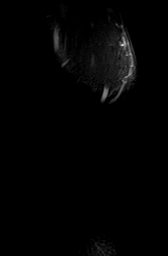

[37 of 40 positions shown; findings below may reference images not displayed]

FINDINGS: Ligaments: No definite could extension of contrast medium along the
midcarpal row is identified. Potential reduced sensitivity due to
similarity of signal between the injected contrast in the articular
cartilage along the midcarpal row.

Triangular fibrocartilage: Frayed and irregular appearance of the
TFCC disc for example on images 13 through 11 of series 7. Small
amount of contrast extends into the distal radioulnar joint
indicating tear. Top candidate location for tear is a small linear
defect about 0.4 cm from the radial attachment site shown for
example on images 12-13 of series 7.

Tendons: Unremarkable

Carpal tunnel/median nerve: Unremarkable

Guyon's canal: Unremarkable

Joint/cartilage: Unremarkable

Bones/carpal alignment: Small degenerative subcortical cyst or geode
along the proximal-ulnar margin of the capitate. Small focus of
subcortical marrow edema in lunate side of the scaphoid a second
small focus of edema signal is present in the distal pole of the
scaphoid near the articulation with the trapezium.

Degenerative findings at the first carpometacarpal articulation with
associated spurring and chondral thinning.

Other: No supplemental non-categorized findings.
IMPRESSION: 1. Frayed and irregular appearance of the TFCC disc, with a
suspected small linear defect in the TFCC disc about 0.4 cm from the
radial attachment, allowing contrast to extend into the distal
radioulnar joint.
2. Degenerative arthropathy at the first carpometacarpal
articulation. Small degenerative subcortical cysts in the capitate
and scaphoid.

## 2020-10-04 MED ORDER — IOPAMIDOL (ISOVUE-M 200) INJECTION 41%
3.0000 mL | Freq: Once | INTRAMUSCULAR | Status: AC
  Administered 2020-10-04: 3 mL via INTRA_ARTICULAR

## 2020-10-14 ENCOUNTER — Other Ambulatory Visit: Payer: Self-pay | Admitting: Orthopedic Surgery

## 2020-10-16 ENCOUNTER — Encounter (HOSPITAL_BASED_OUTPATIENT_CLINIC_OR_DEPARTMENT_OTHER): Payer: Self-pay | Admitting: Orthopedic Surgery

## 2020-10-16 ENCOUNTER — Other Ambulatory Visit: Payer: Self-pay

## 2020-10-18 ENCOUNTER — Other Ambulatory Visit (HOSPITAL_BASED_OUTPATIENT_CLINIC_OR_DEPARTMENT_OTHER)
Admission: RE | Admit: 2020-10-18 | Discharge: 2020-10-18 | Disposition: A | Discharge status: Home / Self Care | Payer: 59 | Source: Ambulatory Visit | Attending: Orthopedic Surgery | Admitting: Orthopedic Surgery

## 2020-10-18 ENCOUNTER — Encounter (HOSPITAL_BASED_OUTPATIENT_CLINIC_OR_DEPARTMENT_OTHER)
Admission: RE | Admit: 2020-10-18 | Discharge: 2020-10-18 | Disposition: A | Discharge status: Home / Self Care | Payer: 59 | Source: Ambulatory Visit | Attending: Orthopedic Surgery | Admitting: Orthopedic Surgery

## 2020-10-18 DIAGNOSIS — Z01812 Encounter for preprocedural laboratory examination: Secondary | ICD-10-CM | POA: Insufficient documentation

## 2020-10-18 DIAGNOSIS — Z20822 Contact with and (suspected) exposure to covid-19: Secondary | ICD-10-CM | POA: Insufficient documentation

## 2020-10-18 NOTE — Progress Notes (Signed)

## 2020-10-19 LAB — SARS CORONAVIRUS 2 (TAT 6-24 HRS): SARS Coronavirus 2: NEGATIVE

## 2020-10-21 NOTE — Anesthesia Preprocedure Evaluation (Addendum)
Anesthesia Evaluation  Patient identified by MRN, date of birth, ID band Patient awake    Reviewed: Allergy & Precautions, NPO status , Patient's Chart, lab work & pertinent test results, reviewed documented beta blocker date and time   Airway Mallampati: III  TM Distance: >3 FB Neck ROM: Full    Dental no notable dental hx. (+) Teeth Intact, Dental Advisory Given   Pulmonary neg pulmonary ROS,    Pulmonary exam normal breath sounds clear to auscultation       Cardiovascular hypertension, Pt. on medications and Pt. on home beta blockers Normal cardiovascular exam Rhythm:Regular Rate:Normal     Neuro/Psych PSYCHIATRIC DISORDERS Anxiety negative neurological ROS     GI/Hepatic negative GI ROS, Neg liver ROS,   Endo/Other  negative endocrine ROS  Renal/GU negative Renal ROS  negative genitourinary   Musculoskeletal  (+) Arthritis , Osteoarthritis,  Triangular fibrocartilage complex L wrist    Abdominal   Peds  Hematology negative hematology ROS (+)   Anesthesia Other Findings   Reproductive/Obstetrics negative OB ROS                            Anesthesia Physical Anesthesia Plan  ASA: II  Anesthesia Plan: MAC and Regional   Post-op Pain Management:  Regional for Post-op pain   Induction:   PONV Risk Score and Plan: 1 and Propofol infusion, TIVA and Treatment may vary due to age or medical condition  Airway Management Planned: Natural Airway and Simple Face Mask  Additional Equipment: None  Intra-op Plan:   Post-operative Plan:   Informed Consent: I have reviewed the patients History and Physical, chart, labs and discussed the procedure including the risks, benefits and alternatives for the proposed anesthesia with the patient or authorized representative who has indicated his/her understanding and acceptance.     Dental advisory given  Plan Discussed with: CRNA  Anesthesia  Plan Comments:        Anesthesia Quick Evaluation

## 2020-10-22 ENCOUNTER — Encounter (HOSPITAL_BASED_OUTPATIENT_CLINIC_OR_DEPARTMENT_OTHER)
Admission: RE | Disposition: A | Discharge status: Home / Self Care | Payer: Self-pay | Source: Home / Self Care | Attending: Orthopedic Surgery

## 2020-10-22 ENCOUNTER — Ambulatory Visit (HOSPITAL_BASED_OUTPATIENT_CLINIC_OR_DEPARTMENT_OTHER): Payer: 59 | Admitting: Anesthesiology

## 2020-10-22 ENCOUNTER — Ambulatory Visit (HOSPITAL_BASED_OUTPATIENT_CLINIC_OR_DEPARTMENT_OTHER)
Admission: RE | Admit: 2020-10-22 | Discharge: 2020-10-22 | Disposition: A | Discharge status: Home / Self Care | Payer: 59 | Attending: Orthopedic Surgery | Admitting: Orthopedic Surgery

## 2020-10-22 ENCOUNTER — Encounter (HOSPITAL_BASED_OUTPATIENT_CLINIC_OR_DEPARTMENT_OTHER): Payer: Self-pay | Admitting: Orthopedic Surgery

## 2020-10-22 ENCOUNTER — Other Ambulatory Visit: Payer: Self-pay

## 2020-10-22 DIAGNOSIS — I10 Essential (primary) hypertension: Secondary | ICD-10-CM | POA: Diagnosis not present

## 2020-10-22 DIAGNOSIS — S63592A Other specified sprain of left wrist, initial encounter: Secondary | ICD-10-CM | POA: Diagnosis not present

## 2020-10-22 DIAGNOSIS — M1812 Unilateral primary osteoarthritis of first carpometacarpal joint, left hand: Secondary | ICD-10-CM | POA: Insufficient documentation

## 2020-10-22 DIAGNOSIS — Z791 Long term (current) use of non-steroidal anti-inflammatories (NSAID): Secondary | ICD-10-CM | POA: Diagnosis not present

## 2020-10-22 DIAGNOSIS — Z79899 Other long term (current) drug therapy: Secondary | ICD-10-CM | POA: Insufficient documentation

## 2020-10-22 DIAGNOSIS — Y939 Activity, unspecified: Secondary | ICD-10-CM | POA: Diagnosis not present

## 2020-10-22 DIAGNOSIS — X58XXXA Exposure to other specified factors, initial encounter: Secondary | ICD-10-CM | POA: Diagnosis not present

## 2020-10-22 HISTORY — PX: WRIST ARTHROSCOPY: SHX838

## 2020-10-22 HISTORY — DX: Unspecified osteoarthritis, unspecified site: M19.90

## 2020-10-22 SURGERY — ARTHROSCOPY, WRIST
Anesthesia: Monitor Anesthesia Care | Site: Wrist | Laterality: Left

## 2020-10-22 MED ORDER — MIDAZOLAM HCL 2 MG/2ML IJ SOLN
2.0000 mg | Freq: Once | INTRAMUSCULAR | Status: AC
  Administered 2020-10-22: 2 mg via INTRAVENOUS

## 2020-10-22 MED ORDER — TRAMADOL HCL 50 MG PO TABS: 50.0000 mg | ORAL_TABLET | Freq: Four times a day (QID) | ORAL | 0 refills | Status: AC | PRN

## 2020-10-22 MED ORDER — LACTATED RINGERS IV SOLN: INTRAVENOUS | Status: DC

## 2020-10-22 MED ORDER — PROPOFOL 10 MG/ML IV BOLUS
INTRAVENOUS | Status: AC
  Filled 2020-10-22: qty 20

## 2020-10-22 MED ORDER — OXYCODONE HCL 5 MG PO TABS
5.0000 mg | ORAL_TABLET | Freq: Once | ORAL | Status: DC | PRN
Start: 2020-10-22 — End: 2020-10-22

## 2020-10-22 MED ORDER — OXYCODONE HCL 5 MG/5ML PO SOLN: 5.0000 mg | Freq: Once | ORAL | Status: DC | PRN

## 2020-10-22 MED ORDER — PROPOFOL 500 MG/50ML IV EMUL
INTRAVENOUS | Status: DC | PRN
  Administered 2020-10-22: 100 ug/kg/min via INTRAVENOUS

## 2020-10-22 MED ORDER — MIDAZOLAM HCL 2 MG/2ML IJ SOLN: 2.0000 mg | Freq: Once | INTRAMUSCULAR | Status: DC

## 2020-10-22 MED ORDER — LIDOCAINE 2% (20 MG/ML) 5 ML SYRINGE
INTRAMUSCULAR | Status: AC
  Filled 2020-10-22: qty 5

## 2020-10-22 MED ORDER — PROPOFOL 10 MG/ML IV BOLUS
INTRAVENOUS | Status: DC | PRN
  Administered 2020-10-22: 30 mg via INTRAVENOUS

## 2020-10-22 MED ORDER — FENTANYL CITRATE (PF) 100 MCG/2ML IJ SOLN: 100.0000 ug | Freq: Once | INTRAMUSCULAR | Status: DC

## 2020-10-22 MED ORDER — LIDOCAINE 2% (20 MG/ML) 5 ML SYRINGE
INTRAMUSCULAR | Status: DC | PRN
  Administered 2020-10-22: 40 mg via INTRAVENOUS

## 2020-10-22 MED ORDER — FENTANYL CITRATE (PF) 100 MCG/2ML IJ SOLN
100.0000 ug | Freq: Once | INTRAMUSCULAR | Status: AC
  Administered 2020-10-22: 100 ug via INTRAVENOUS

## 2020-10-22 MED ORDER — ACETAMINOPHEN 500 MG PO TABS
ORAL_TABLET | ORAL | Status: AC
  Filled 2020-10-22: qty 2

## 2020-10-22 MED ORDER — HYDROMORPHONE HCL 1 MG/ML IJ SOLN: 0.2500 mg | INTRAMUSCULAR | Status: DC | PRN

## 2020-10-22 MED ORDER — CEFAZOLIN SODIUM-DEXTROSE 2-4 GM/100ML-% IV SOLN
INTRAVENOUS | Status: AC
  Filled 2020-10-22: qty 100

## 2020-10-22 MED ORDER — CEFAZOLIN SODIUM-DEXTROSE 2-4 GM/100ML-% IV SOLN
2.0000 g | INTRAVENOUS | Status: AC
  Administered 2020-10-22: 2 g via INTRAVENOUS

## 2020-10-22 MED ORDER — ACETAMINOPHEN 500 MG PO TABS
1000.0000 mg | ORAL_TABLET | Freq: Once | ORAL | Status: AC
  Administered 2020-10-22: 1000 mg via ORAL

## 2020-10-22 MED ORDER — ROPIVACAINE HCL 5 MG/ML IJ SOLN
INTRAMUSCULAR | Status: DC | PRN
  Administered 2020-10-22: 25 mL via PERINEURAL

## 2020-10-22 MED ORDER — ONDANSETRON HCL 4 MG/2ML IJ SOLN: 4.0000 mg | Freq: Once | INTRAMUSCULAR | Status: DC | PRN

## 2020-10-22 MED ORDER — MIDAZOLAM HCL 5 MG/5ML IJ SOLN
INTRAMUSCULAR | Status: DC | PRN
  Administered 2020-10-22: 2 mg via INTRAVENOUS

## 2020-10-22 MED ORDER — FENTANYL CITRATE (PF) 100 MCG/2ML IJ SOLN
INTRAMUSCULAR | Status: AC
  Filled 2020-10-22: qty 2

## 2020-10-22 MED ORDER — ONDANSETRON HCL 4 MG/2ML IJ SOLN
INTRAMUSCULAR | Status: DC | PRN
  Administered 2020-10-22: 4 mg via INTRAVENOUS

## 2020-10-22 MED ORDER — MIDAZOLAM HCL 2 MG/2ML IJ SOLN
INTRAMUSCULAR | Status: AC
  Filled 2020-10-22: qty 2

## 2020-10-22 MED ORDER — SODIUM CHLORIDE 0.9 % IV SOLN
INTRAVENOUS | Status: AC | PRN
  Administered 2020-10-22: 1200 mL via INTRAMUSCULAR

## 2020-10-22 SURGICAL SUPPLY — 72 items
APL PRP STRL LF DISP 70% ISPRP (MISCELLANEOUS) ×1
BLADE EAR TYMPAN 2.5 60D BEAV (BLADE) IMPLANT
BLADE MINI RND TIP GREEN BEAV (BLADE) IMPLANT
BLADE SURG 15 STRL LF DISP TIS (BLADE) ×1 IMPLANT
BLADE SURG 15 STRL SS (BLADE) ×2
BNDG CMPR 9X4 STRL LF SNTH (GAUZE/BANDAGES/DRESSINGS)
BNDG COHESIVE 2X5 TAN STRL LF (GAUZE/BANDAGES/DRESSINGS) ×2 IMPLANT
BNDG COHESIVE 3X5 TAN STRL LF (GAUZE/BANDAGES/DRESSINGS) ×2 IMPLANT
BNDG ESMARK 4X9 LF (GAUZE/BANDAGES/DRESSINGS) IMPLANT
BNDG GAUZE ELAST 4 BULKY (GAUZE/BANDAGES/DRESSINGS) ×2 IMPLANT
BURR SM HUB 3.0 OVAL (BLADE) IMPLANT
CANISTER SUCT 1200ML W/VALVE (MISCELLANEOUS) ×2 IMPLANT
CHLORAPREP W/TINT 26 (MISCELLANEOUS) ×2 IMPLANT
CORD BIPOLAR FORCEPS 12FT (ELECTRODE) IMPLANT
COVER BACK TABLE 60X90IN (DRAPES) ×2 IMPLANT
COVER MAYO STAND STRL (DRAPES) ×2 IMPLANT
COVER WAND RF STERILE (DRAPES) IMPLANT
CUFF TOURN SGL QUICK 18X4 (TOURNIQUET CUFF) ×2 IMPLANT
DRAPE EXTREMITY T 121X128X90 (DISPOSABLE) ×2 IMPLANT
DRAPE IMP U-DRAPE 54X76 (DRAPES) ×2 IMPLANT
DRAPE OEC MINIVIEW 54X84 (DRAPES) IMPLANT
DRAPE SURG 17X23 STRL (DRAPES) ×2 IMPLANT
ELECT SMALL JOINT 90D BASC (ELECTRODE) IMPLANT
GAUZE SPONGE 4X4 12PLY STRL (GAUZE/BANDAGES/DRESSINGS) ×2 IMPLANT
GAUZE XEROFORM 1X8 LF (GAUZE/BANDAGES/DRESSINGS) ×2 IMPLANT
GLOVE SRG 8 PF TXTR STRL LF DI (GLOVE) ×1 IMPLANT
GLOVE SURG ENC MOIS LTX SZ6.5 (GLOVE) ×4 IMPLANT
GLOVE SURG ORTHO LTX SZ8 (GLOVE) ×2 IMPLANT
GLOVE SURG UNDER POLY LF SZ7 (GLOVE) ×6 IMPLANT
GLOVE SURG UNDER POLY LF SZ8 (GLOVE) ×2
GLOVE SURG UNDER POLY LF SZ8.5 (GLOVE) ×4 IMPLANT
GOWN STRL REUS W/ TWL LRG LVL3 (GOWN DISPOSABLE) ×1 IMPLANT
GOWN STRL REUS W/ TWL XL LVL3 (GOWN DISPOSABLE) ×1 IMPLANT
GOWN STRL REUS W/TWL LRG LVL3 (GOWN DISPOSABLE) ×2
GOWN STRL REUS W/TWL XL LVL3 (GOWN DISPOSABLE) ×6 IMPLANT
IV NS IRRIG 3000ML ARTHROMATIC (IV SOLUTION) ×2 IMPLANT
IV SET EXT 30 76VOL 4 MALE LL (IV SETS) ×2 IMPLANT
NDL SAFETY ECLIPSE 18X1.5 (NEEDLE) ×3 IMPLANT
NEEDLE HYPO 18GX1.5 SHARP (NEEDLE) ×6
NEEDLE HYPO 22GX1.5 SAFETY (NEEDLE) ×2 IMPLANT
NEEDLE SPNL 18GX3.5 QUINCKE PK (NEEDLE) IMPLANT
NEEDLE TUOHY 20GX3.5 (NEEDLE) IMPLANT
NS IRRIG 1000ML POUR BTL (IV SOLUTION) IMPLANT
PACK BASIN DAY SURGERY FS (CUSTOM PROCEDURE TRAY) ×2 IMPLANT
PAD CAST 3X4 CTTN HI CHSV (CAST SUPPLIES) ×1 IMPLANT
PADDING CAST ABS 3INX4YD NS (CAST SUPPLIES) ×1
PADDING CAST ABS 4INX4YD NS (CAST SUPPLIES) ×1
PADDING CAST ABS COTTON 3X4 (CAST SUPPLIES) ×1 IMPLANT
PADDING CAST ABS COTTON 4X4 ST (CAST SUPPLIES) ×1 IMPLANT
PADDING CAST COTTON 3X4 STRL (CAST SUPPLIES) ×2
SET SM JOINT TUBING/CANN (CANNULA) IMPLANT
SHAVER DISSECTOR 3.0 (BURR) ×2 IMPLANT
SHAVER SABRE 2.0 (BURR) ×2 IMPLANT
SLEEVE SCD COMPRESS KNEE MED (STOCKING) IMPLANT
SLING ARM FOAM STRAP XLG (SOFTGOODS) ×2 IMPLANT
SPLINT PLASTER CAST XFAST 3X15 (CAST SUPPLIES) IMPLANT
SPLINT PLASTER XTRA FASTSET 3X (CAST SUPPLIES)
STOCKINETTE 4X48 STRL (DRAPES) ×2 IMPLANT
SUCTION FRAZIER HANDLE 10FR (MISCELLANEOUS)
SUCTION TUBE FRAZIER 10FR DISP (MISCELLANEOUS) IMPLANT
SUT ETHILON 4 0 PS 2 18 (SUTURE) ×2 IMPLANT
SUT MERSILENE 4 0 P 3 (SUTURE) IMPLANT
SUT PDS AB 2-0 CT2 27 (SUTURE) IMPLANT
SUT VIC AB 2-0 PS2 27 (SUTURE) IMPLANT
SUT VICRYL 4-0 PS2 18IN ABS (SUTURE) IMPLANT
SYR BULB EAR ULCER 3OZ GRN STR (SYRINGE) IMPLANT
SYR CONTROL 10ML LL (SYRINGE) ×2 IMPLANT
TUBE CONNECTING 20X1/4 (TUBING) IMPLANT
TUBING ARTHROSCOPY IRRIG 16FT (MISCELLANEOUS) ×2 IMPLANT
UNDERPAD 30X36 HEAVY ABSORB (UNDERPADS AND DIAPERS) ×2 IMPLANT
WAND 1.5 MICROBLATOR (SURGICAL WAND) ×2 IMPLANT
WATER STERILE IRR 1000ML POUR (IV SOLUTION) ×2 IMPLANT

## 2020-10-22 NOTE — Progress Notes (Signed)
Assisted Dr. Finucane with left, ultrasound guided, supraclavicular block. Side rails up, monitors on throughout procedure. See vital signs in flow sheet. Tolerated Procedure well. 

## 2020-10-22 NOTE — Anesthesia Procedure Notes (Addendum)
Anesthesia Regional Block: Supraclavicular block   Pre-Anesthetic Checklist: ,, timeout performed, Correct Patient, Correct Site, Correct Laterality, Correct Procedure, Correct Position, site marked, Risks and benefits discussed,  Surgical consent,  Pre-op evaluation,  At surgeon's request and post-op pain management  Laterality: Left  Prep: Maximum Sterile Barrier Precautions used, chloraprep       Needles:  Injection technique: Single-shot  Needle Type: Echogenic Stimulator Needle     Needle Length: 9cm  Needle Gauge: 22     Additional Needles:   Procedures:,,,, ultrasound used (permanent image in chart),,,,  Narrative:  Start time: 10/22/2020 11:55 AM End time: 10/22/2020 12:00 PM Injection made incrementally with aspirations every 5 mL.  Performed by: Personally  Anesthesiologist: Lannie Fields, DO  Additional Notes: Monitors applied. No increased pain on injection. No increased resistance to injection. Injection made in 5cc increments. Good needle visualization. Patient tolerated procedure well.

## 2020-10-22 NOTE — Discharge Instructions (Addendum)
[facility] Center Instructions Hand Surgery  Wound Care: Keep your hand elevated above the level of your heart.  Do not allow it to dangle by your side.  Keep the dressing dry and do not remove it unless your doctor advises you to do so.  He will usually change it at the time of your post-op visit.  Moving your fingers is advised to stimulate circulation but will depend on the site of your surgery.  If you have a splint applied, your doctor will advise you regarding movement.  Activity: Do not drive or operate machinery today.  Rest today and then you may return to your normal activity and work as indicated by your physician.  Diet:  Drink liquids today or eat a light diet.  You may resume a regular diet tomorrow.    General expectations: Pain for two to three days. Fingers may become slightly swollen.  Call your doctor if any of the following occur: Severe pain not relieved by pain medication. Elevated temperature. Dressing soaked with blood. Inability to move fingers. White or bluish color to fingers.   No Tylenol until after 6pm today if needed  Post Anesthesia Home Care Instructions  Activity: Get plenty of rest for the remainder of the day. A responsible individual must stay with you for 24 hours following the procedure.  For the next 24 hours, DO NOT: -Drive a car -Advertising copywriter -Drink alcoholic beverages -Take any medication unless instructed by your physician -Make any legal decisions or sign important papers.  Meals: Start with liquid foods such as gelatin or soup. Progress to regular foods as tolerated. Avoid greasy, spicy, heavy foods. If nausea and/or vomiting occur, drink only clear liquids until the nausea and/or vomiting subsides. Call your physician if vomiting continues.  Special Instructions/Symptoms: Your throat may feel dry or sore from the anesthesia or the breathing tube placed in your throat during surgery. If this causes discomfort, gargle with warm salt  water. The discomfort should disappear within 24 hours.  If you had a scopolamine patch placed behind your ear for the management of post- operative nausea and/or vomiting:  1. The medication in the patch is effective for 72 hours, after which it should be removed.  Wrap patch in a tissue and discard in the trash. Wash hands thoroughly with soap and water. 2. You may remove the patch earlier than 72 hours if you experience unpleasant side effects which may include dry mouth, dizziness or visual disturbances. 3. Avoid touching the patch. Wash your hands with soap and water after contact with the patch.    Regional Anesthesia Blocks  1. Numbness or the inability to move the "blocked" extremity may last from 3-48 hours after placement. The length of time depends on the medication injected and your individual response to the medication. If the numbness is not going away after 48 hours, call your surgeon.  2. The extremity that is blocked will need to be protected until the numbness is gone and the  Strength has returned. Because you cannot feel it, you will need to take extra care to avoid injury. Because it may be weak, you may have difficulty moving it or using it. You may not know what position it is in without looking at it while the block is in effect.  3. For blocks in the legs and feet, returning to weight bearing and walking needs to be done carefully. You will need to wait until the numbness is entirely gone and the strength has  returned. You should be able to move your leg and foot normally before you try and bear weight or walk. You will need someone to be with you when you first try to ensure you do not fall and possibly risk injury.  4. Bruising and tenderness at the needle site are common side effects and will resolve in a few days.  5. Persistent numbness or new problems with movement should be communicated to the surgeon or the [place] [phone] [place] [phone]).

## 2020-10-22 NOTE — Op Note (Signed)
I assisted Surgeon(s) and Role:    * Cindee Salt, MD - Primary    Betha Loa, MD on the Procedure(s): ARTHROSCOPY LEFT WRIST WITH DEBRIDEMENT on 10/22/2020.  I provided assistance on this case as follows: setup and management of arthroscopy equipment.  Electronically signed by: Betha Loa, MD Date: 10/22/2020 Time: 2:29 PM

## 2020-10-22 NOTE — Op Note (Signed)
NAME: Benjamin Ray MEDICAL RECORD NO: [id number] DATE OF BIRTH: [birth year] FACILITY: [facility] LOCATION:  SURGERY CENTER PHYSICIAN: Nicki Reaper, MD   OPERATIVE REPORT   DATE OF PROCEDURE: 10/22/20    PREOPERATIVE DIAGNOSIS:   TFCC tear left wrist   POSTOPERATIVE DIAGNOSIS:   Same   PROCEDURE:   Arthroscopy left wrist with debridement TFCC tear   SURGEON: Cindee Salt, M.D.   ASSISTANT: Betha Loa, MD   ANESTHESIA:  Regional with sedation   INTRAVENOUS FLUIDS:  Per anesthesia flow sheet.   ESTIMATED BLOOD LOSS:  Minimal.   COMPLICATIONS:  None.   SPECIMENS:  none   TOURNIQUET TIME:   No turning  DISPOSITION:  Stable to PACU.   INDICATIONS: Patient is a 55 year old male with ulnar-sided wrist pain arthroscopy proposed following MRI which reveals a TFCC tear.  He is aware there is no guarantee to the surgery the possibility of infection recurrence injury to arteries nerves tendons incomplete relief of symptoms and dystrophy.  In the preoperative area the patient seen the extremity marked by both patient and surgeon antibiotic given a supraclavicular block was carried out without difficulty under the direction of the anesthesia department in the preoperative area.  OPERATIVE COURSE: Patient brought the operating room placed in the supine position with left arm free.  ChloraPrep was done with ChloraPrep 3-minute drying time allowed timeout to confirm patient procedure.  The limb was placed in the arc arthroscopy tower and 10 pounds of traction applied.  The joint was inflated through the 3-4 portal transverse incision made deepened with a hemostat a blunt trocar was used to enter the joint the joint was inspected volar radial wrist ligaments were intact.  Scapholunate ligament was intact.  There was no significant cartilage damage on the proximal carpal row.  The TFCC had a large longitudinal tear in the mid substance.  The lunotriquetral ligament was able to be visualized  and found to be intact moderate synovitis was present and the irrigation catheter was placed in the 6 you portal.  This was an 18-gauge needle.  A 5 6 portal was then opened after localization with a 22-gauge needle transverse incision made deepened with a hemostat a blunt trocar used to enter the joint.  A full-radius shaver 2 mm was then placed a debridement of the TFCC was then performed.  This was changed with a 3 mm aggressive shaver and further debridement was performed.  An ArthroWand was then inserted the synovectomy was performed using the ArthroWand as well as further smoothing of the triangular fibrocartilage complex hole which had been created with the shavers removing the tear this was done with assured good flow throughout the shrinkage procedure.  Midcarpal joint was then inspected attempt was made to enter the radial midcarpal portal which was not successful.  This was done after localization with a 22-gauge needle a large vein was present in the ulnar portal it was decided to proceed with a ulnar midcarpal portal traction on the large vein the joint localized with a 22-gauge needle transverse incision made deepened with a hemostat blunt trocar was used to enter the joint joint was able to be inspected there was no significant articular damage except for the dorsal aspect of the lunate which did show a roughened area.  The instruments were removed no gross instability of the scapholunate lunotriquetral.  The portals were closed with interrupted 4-0 nylon sutures.  A sterile compressive dressing volar splint was applied.  The patient tolerated the  procedure well was taken to the recovery room for observation in satisfactory condition.  He will be discharged home to return to the hand center of [facility] in 1 week Tylenol ibuprofen for pain with Ultram for breakthrough.  Cindee Salt, MD Electronically signed, 10/22/20

## 2020-10-22 NOTE — Transfer of Care (Signed)
Immediate Anesthesia Transfer of Care Note  Patient: Benjamin Ray  Procedure(s) Performed: ARTHROSCOPY LEFT WRIST WITH DEBRIDEMENT (Left Wrist)  Patient Location: PACU  Anesthesia Type:MAC and Regional  Level of Consciousness: drowsy and patient cooperative  Airway & Oxygen Therapy: Patient Spontanous Breathing and Patient connected to face mask oxygen  Post-op Assessment: Report given to RN and Post -op Vital signs reviewed and stable  Post vital signs: Reviewed and stable  Last Vitals:  Vitals Value Taken Time  BP 72/53 10/22/20 1431  Temp    Pulse 61 10/22/20 1433  Resp 20 10/22/20 1433  SpO2 95 % 10/22/20 1433  Vitals shown include unvalidated device data.  Last Pain:  Vitals:   10/22/20 1130  TempSrc: Oral  PainSc: 3       Patients Stated Pain Goal: 3 (29/56/21 3086)  Complications: No complications documented.

## 2020-10-22 NOTE — Brief Op Note (Signed)
10/22/2020  2:29 PM  PATIENT:  Tammi Sou  55 y.o. male  PRE-OPERATIVE DIAGNOSIS:  TRIANGULAR FIBROCARTILAGE COMPLEX LEFT WRIST  POST-OPERATIVE DIAGNOSIS:  TRIANGULAR FIBROCARTILAGE COMPLEX LEFT WRIST  PROCEDURE:  Procedure(s) with comments: ARTHROSCOPY LEFT WRIST WITH DEBRIDEMENT (Left) - AXILLARY BLOCK  SURGEON:  Surgeon(s) and Role:    * Cindee Salt, MD - Primary    * Betha Loa, MD  PHYSICIAN ASSISTANT:   Vanita Ingles MD  ANESTHESIA:   regional and IV sedation  EBL: 21ml  BLOOD ADMINISTERED:none  DRAINS: none   LOCAL MEDICATIONS USED:  NONE  SPECIMEN:  No Specimen  DISPOSITION OF SPECIMEN:  N/A  COUNTS:  YES  TOURNIQUET: not used DICTATION: .Dragon Dictation  PLAN OF CARE: Discharge to home after PACU  PATIENT DISPOSITION:  PACU - hemodynamically stable.

## 2020-10-22 NOTE — H&P (Signed)
Benjamin Ray is an 55 y.o. male.   Chief Complaint: Ulnar-sided wrist pain and clicking left wrist HPI: Benjamin Ray is a 55 year old right-hand-dominant male who is in the history of pain in his left wrist.  He has had arthroscopy in the past with partial debridement. He has complained of pain in his wrist. He has no history of injury to his wrist. States that the pain is localized in the central aspect of his wrist along the scapholunate ligament area slightly ulnar. This is mild to moderate in nature with use of his hand especially radial and ulnar deviation pronation supination. He has tried nonsteroidal anti-inflammatories which have not helped. He states nothing seems to make it better or worse. Has no specific history of injury. He was sent for arthrographic MRI to rule out TFCC injury of possible scapholunate ligament injury. This is been done read by Dr. Charise Killian with a TFCC tear. He has a history of gout no history of diabetes thyroid problems arthritis. His family history is negative for each. Past Medical History:    Past Medical History:  Diagnosis Date  . Anxiety    panic attacks  . Arthritis    left thumb  . Gout   . Hyperlipemia   . Hypertension   . Trigger finger    LMF    Past Surgical History:  Procedure Laterality Date  . HERNIA REPAIR    . MASS EXCISION Left 12/11/2013   Procedure: EXCISION MASS LEFT INDEX FINGER;  Surgeon: Nicki Reaper, MD;  Location: [facility];  Service: Orthopedics;  Laterality: Left;  . SHOULDER ARTHROSCOPY WITH ROTATOR CUFF REPAIR  2012   right  . TRIGGER FINGER RELEASE Left 11/15/2018   Procedure: RELEASE LEFT MIDDLE TRIGGER FINGER/A-1 PULLEY;  Surgeon: Cindee Salt, MD;  Location: [facility];  Service: Orthopedics;  Laterality: Left;  . UMBILICAL HERNIA REPAIR  2010  . WRIST ARTHROSCOPY WITH DEBRIDEMENT Right 01/12/2019   Procedure: WRIST ARTHROSCOPY WITH DEBRIDEMENT OF TRIANGULAR FIBROCARTILEGE COMPLEX  TEAR;  Surgeon:  Cindee Salt, MD;  Location: [facility];  Service: Orthopedics;  Laterality: Right;  AXILLARY    History reviewed. No pertinent family history. Social History:  reports that he has never smoked. He has never used smokeless tobacco. He reports previous alcohol use. He reports that he does not use drugs.  Allergies: No Known Allergies  Medications Prior to Admission  Medication Sig Dispense Refill  . allopurinol (ZYLOPRIM) 100 MG tablet Take 200 mg by mouth daily.     Marland Kitchen amLODipine (NORVASC) 5 MG tablet Take 5 mg by mouth daily.    Marland Kitchen atenolol (TENORMIN) 50 MG tablet Take 50 mg by mouth daily.    Marland Kitchen escitalopram (LEXAPRO) 10 MG tablet Take 10 mg by mouth daily.    . meloxicam (MOBIC) 7.5 MG tablet TAKE 1 TABLET(7.5 MG) BY MOUTH DAILY WITH FOOD    . pravastatin (PRAVACHOL) 20 MG tablet Take 20 mg by mouth daily.    . valsartan (DIOVAN) 160 MG tablet Take 320 mg by mouth daily.       No results found for this or any previous visit (from the past 48 hour(s)).  No results found.   Pertinent items are noted in HPI.  Height 6' (1.829 m), weight 103.9 kg.  General appearance: alert, cooperative and appears stated age Head: Normocephalic, without obvious abnormality Neck: no JVD Resp: clear to auscultation bilaterally Cardio: regular rate and rhythm, S1, S2 normal, no murmur, click, rub  or gallop GI: soft, non-tender; bowel sounds normal; no masses,  no organomegaly Extremities: Ulnar wrist pain Pulses: 2+ and symmetric Skin: Skin color, texture, turgor normal. No rashes or lesions Neurologic: Grossly normal Incision/Wound: na Assessment/Plan Assessment:  1. Primary osteoarthritis of first carpometacarpal joint of left hand  2. 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 He would like to proceed to have that done. This he is  scheduled for arthroscopy left wrist debridement TFCC possible repair as dictated by findings as an outpatient under regional anesthesia. Questions are encouraged and answered to his satisfaction.    Cindee Salt 10/22/2020, 11:19 AM

## 2020-10-22 NOTE — Anesthesia Procedure Notes (Signed)
Date/Time: 10/22/2020 1:23 PM Performed by: Thornell Mule, CRNA Oxygen Delivery Method: Simple face mask

## 2020-10-22 NOTE — Anesthesia Postprocedure Evaluation (Signed)
Anesthesia Post Note  Patient: Benjamin Ray  Procedure(s) Performed: ARTHROSCOPY LEFT WRIST WITH DEBRIDEMENT (Left Wrist)     Patient location during evaluation: PACU Anesthesia Type: Regional and MAC Level of consciousness: awake and alert Pain management: pain level controlled Vital Signs Assessment: post-procedure vital signs reviewed and stable Respiratory status: spontaneous breathing, nonlabored ventilation and respiratory function stable Cardiovascular status: blood pressure returned to baseline and stable Postop Assessment: no apparent nausea or vomiting Anesthetic complications: no   No complications documented.  Last Vitals:  Vitals:   10/22/20 1445 10/22/20 1500  BP: (!) 92/57 (!) 92/53  Pulse: 65 65  Resp: 15 18  Temp:    SpO2: 97% 98%    Last Pain:  Vitals:   10/22/20 1500  TempSrc:   PainSc: 0-No pain                 Pervis Hocking

## 2020-10-23 ENCOUNTER — Encounter (HOSPITAL_BASED_OUTPATIENT_CLINIC_OR_DEPARTMENT_OTHER): Payer: Self-pay | Admitting: Orthopedic Surgery

## 2022-10-30 ENCOUNTER — Other Ambulatory Visit: Payer: Self-pay | Admitting: Orthopedic Surgery
